# Patient Record
Sex: Female | Born: 1935 | Race: White | Hispanic: No
Health system: Southern US, Community
[De-identification: ages and names within clinical notes are randomized; demographics above are authoritative.]

---

## 2004-05-22 ENCOUNTER — Ambulatory Visit: Payer: Self-pay | Admitting: Unknown Physician Specialty

## 2004-05-30 ENCOUNTER — Ambulatory Visit: Payer: Self-pay | Admitting: Unknown Physician Specialty

## 2004-06-13 ENCOUNTER — Ambulatory Visit: Payer: Self-pay | Admitting: Unknown Physician Specialty

## 2004-07-14 ENCOUNTER — Ambulatory Visit: Payer: Self-pay | Admitting: Unknown Physician Specialty

## 2005-01-14 ENCOUNTER — Ambulatory Visit: Payer: Self-pay | Admitting: Unknown Physician Specialty

## 2005-12-09 ENCOUNTER — Ambulatory Visit: Payer: Self-pay | Admitting: Unknown Physician Specialty

## 2010-04-20 ENCOUNTER — Inpatient Hospital Stay: Payer: Self-pay | Admitting: Internal Medicine

## 2010-05-16 ENCOUNTER — Ambulatory Visit: Payer: Self-pay | Admitting: Unknown Physician Specialty

## 2011-08-03 ENCOUNTER — Emergency Department: Payer: Self-pay | Admitting: *Deleted

## 2011-08-03 LAB — COMPREHENSIVE METABOLIC PANEL
Albumin: 4 g/dL (ref 3.4–5.0)
Alkaline Phosphatase: 112 U/L (ref 50–136)
Bilirubin,Total: 0.9 mg/dL (ref 0.2–1.0)
Calcium, Total: 9.2 mg/dL (ref 8.5–10.1)
Co2: 29 mmol/L (ref 21–32)
Creatinine: 0.81 mg/dL (ref 0.60–1.30)
EGFR (African American): >60
EGFR (Non-African Amer.): >60
Osmolality: 290 (ref 275–301)
Potassium: 4.2 mmol/L (ref 3.5–5.1)
SGOT(AST): 39 U/L — ABNORMAL HIGH (ref 15–37)
SGPT (ALT): 32 U/L
Total Protein: 7.3 g/dL (ref 6.4–8.2)

## 2011-08-03 LAB — CK TOTAL AND CKMB (NOT AT ARMC)
CK, Total: 98 U/L (ref 21–215)
CK-MB: 1.1 ng/mL (ref 0.5–3.6)

## 2011-08-03 LAB — PROTIME-INR
INR: 1
Prothrombin Time: 13.3 secs (ref 11.5–14.7)

## 2011-08-03 LAB — CBC
MCH: 32 pg (ref 26.0–34.0)
Platelet: 165 10*3/uL (ref 150–440)
RDW: 13.6 % (ref 11.5–14.5)

## 2011-08-03 LAB — TROPONIN I: Troponin-I: <0.02 ng/mL

## 2011-08-03 LAB — APTT: Activated PTT: 36 secs — ABNORMAL HIGH (ref 23.6–35.9)

## 2011-11-30 ENCOUNTER — Emergency Department: Payer: Self-pay | Admitting: Emergency Medicine

## 2012-01-12 ENCOUNTER — Emergency Department: Payer: Self-pay | Admitting: Emergency Medicine

## 2012-01-12 LAB — LIPASE, BLOOD: Lipase: 69 U/L — ABNORMAL LOW (ref 73–393)

## 2012-01-12 LAB — URINALYSIS, COMPLETE
Bilirubin,UR: NEGATIVE
Glucose,UR: 50 mg/dL (ref 0–75)
Ketone: NEGATIVE
RBC,UR: 36 /HPF (ref 0–5)
Specific Gravity: 1.01 (ref 1.003–1.030)
Squamous Epithelial: 13
Transitional Epi: 7
WBC UR: 119 /HPF (ref 0–5)

## 2012-01-12 LAB — COMPREHENSIVE METABOLIC PANEL
Albumin: 3.4 g/dL (ref 3.4–5.0)
Alkaline Phosphatase: 331 U/L — ABNORMAL HIGH (ref 50–136)
BUN: 14 mg/dL (ref 7–18)
Bilirubin,Total: 1 mg/dL (ref 0.2–1.0)
Chloride: 102 mmol/L (ref 98–107)
Creatinine: 0.87 mg/dL (ref 0.60–1.30)
EGFR (African American): >60
EGFR (Non-African Amer.): 58 — ABNORMAL LOW
Glucose: 275 mg/dL — ABNORMAL HIGH (ref 65–99)
Potassium: 3.3 mmol/L — ABNORMAL LOW (ref 3.5–5.1)
SGOT(AST): 65 U/L — ABNORMAL HIGH (ref 15–37)
SGPT (ALT): 52 U/L (ref 12–78)
Total Protein: 6.5 g/dL (ref 6.4–8.2)

## 2012-01-12 LAB — CBC
HCT: 45.1 % (ref 35.0–47.0)
MCHC: 32.6 g/dL (ref 32.0–36.0)
MCV: 100 fL (ref 80–100)
Platelet: 198 10*3/uL (ref 150–440)
RBC: 4.52 10*6/uL (ref 3.80–5.20)
RDW: 14.9 % — ABNORMAL HIGH (ref 11.5–14.5)

## 2012-02-07 ENCOUNTER — Ambulatory Visit: Payer: Self-pay | Admitting: Obstetrics and Gynecology

## 2012-03-12 LAB — URINALYSIS, COMPLETE
Glucose,UR: NEGATIVE mg/dL (ref 0–75)
Ph: 7 (ref 4.5–8.0)
RBC,UR: 1 /HPF (ref 0–5)
Squamous Epithelial: <1
WBC UR: 5 /HPF (ref 0–5)

## 2012-03-12 LAB — CBC WITH DIFFERENTIAL/PLATELET
Basophil %: 0.7 %
Lymphocyte #: 1.1 10*3/uL (ref 1.0–3.6)
MCH: 33.8 pg (ref 26.0–34.0)
MCHC: 33.7 g/dL (ref 32.0–36.0)
MCV: 101 fL — ABNORMAL HIGH (ref 80–100)
Monocyte #: 0.8 x10 3/mm (ref 0.2–0.9)
Neutrophil #: 6.7 10*3/uL — ABNORMAL HIGH (ref 1.4–6.5)
Neutrophil %: 75 %
Platelet: 193 10*3/uL (ref 150–440)
RDW: 14.4 % (ref 11.5–14.5)
WBC: 8.9 10*3/uL (ref 3.6–11.0)

## 2012-03-12 LAB — APTT: Activated PTT: 38.9 secs — ABNORMAL HIGH (ref 23.6–35.9)

## 2012-03-12 LAB — LIPID PANEL
Cholesterol: 136 mg/dL (ref 0–200)
HDL Cholesterol: 83 mg/dL — ABNORMAL HIGH (ref 40–60)
Ldl Cholesterol, Calc: 35 mg/dL (ref 0–100)
VLDL Cholesterol, Calc: 18 mg/dL (ref 5–40)

## 2012-03-12 LAB — COMPREHENSIVE METABOLIC PANEL
Calcium, Total: 9.4 mg/dL (ref 8.5–10.1)
Chloride: 96 mmol/L — ABNORMAL LOW (ref 98–107)
Co2: 35 mmol/L — ABNORMAL HIGH (ref 21–32)
EGFR (African American): >60
EGFR (Non-African Amer.): >60
SGOT(AST): 42 U/L — ABNORMAL HIGH (ref 15–37)
SGPT (ALT): 33 U/L (ref 12–78)

## 2012-03-12 LAB — CK TOTAL AND CKMB (NOT AT ARMC)
CK, Total: 116 U/L (ref 21–215)
CK, Total: 74 U/L (ref 21–215)
CK-MB: 3.2 ng/mL (ref 0.5–3.6)
CK-MB: 2.3 ng/mL (ref 0.5–3.6)

## 2012-03-12 LAB — TROPONIN I: Troponin-I: 0.05 ng/mL

## 2012-03-12 LAB — PRO B NATRIURETIC PEPTIDE: B-Type Natriuretic Peptide: 7021 pg/mL — ABNORMAL HIGH (ref 0–450)

## 2012-03-13 ENCOUNTER — Inpatient Hospital Stay: Payer: Self-pay | Admitting: Internal Medicine

## 2012-03-13 LAB — CBC WITH DIFFERENTIAL/PLATELET
Basophil #: 0 10*3/uL (ref 0.0–0.1)
Eosinophil #: 0.1 10*3/uL (ref 0.0–0.7)
HCT: 41.8 % (ref 35.0–47.0)
Lymphocyte #: 0.8 10*3/uL — ABNORMAL LOW (ref 1.0–3.6)
Lymphocyte %: 7.9 %
MCHC: 34.3 g/dL (ref 32.0–36.0)
MCV: 101 fL — ABNORMAL HIGH (ref 80–100)
Monocyte #: 1.1 x10 3/mm — ABNORMAL HIGH (ref 0.2–0.9)
Neutrophil #: 8.1 10*3/uL — ABNORMAL HIGH (ref 1.4–6.5)
Platelet: 194 10*3/uL (ref 150–440)
RDW: 14.1 % (ref 11.5–14.5)
WBC: 10.2 10*3/uL (ref 3.6–11.0)

## 2012-03-13 LAB — BASIC METABOLIC PANEL
Anion Gap: 8 (ref 7–16)
Chloride: 98 mmol/L (ref 98–107)
Co2: 39 mmol/L — ABNORMAL HIGH (ref 21–32)
EGFR (Non-African Amer.): >60
Osmolality: 295 (ref 275–301)
Potassium: 2.5 mmol/L — CL (ref 3.5–5.1)

## 2012-03-13 LAB — POTASSIUM: Potassium: 3.1 mmol/L — ABNORMAL LOW (ref 3.5–5.1)

## 2012-03-13 LAB — CK TOTAL AND CKMB (NOT AT ARMC): CK-MB: 2 ng/mL (ref 0.5–3.6)

## 2012-03-13 LAB — TROPONIN I: Troponin-I: 0.06 ng/mL — ABNORMAL HIGH

## 2012-03-14 LAB — BASIC METABOLIC PANEL
Anion Gap: 7 (ref 7–16)
BUN: 16 mg/dL (ref 7–18)
Chloride: 98 mmol/L (ref 98–107)
Creatinine: 0.62 mg/dL (ref 0.60–1.30)
EGFR (African American): >60
EGFR (Non-African Amer.): >60
Glucose: 178 mg/dL — ABNORMAL HIGH (ref 65–99)
Osmolality: 287 (ref 275–301)
Sodium: 141 mmol/L (ref 136–145)

## 2012-03-14 LAB — URINE CULTURE

## 2012-03-15 LAB — POTASSIUM: Potassium: 3.8 mmol/L (ref 3.5–5.1)

## 2012-04-15 ENCOUNTER — Emergency Department: Payer: Self-pay | Admitting: Emergency Medicine

## 2012-04-15 LAB — COMPREHENSIVE METABOLIC PANEL
Albumin: 3.5 g/dL (ref 3.4–5.0)
Anion Gap: 5 — ABNORMAL LOW (ref 7–16)
BUN: 16 mg/dL (ref 7–18)
Bilirubin,Total: 0.4 mg/dL (ref 0.2–1.0)
Chloride: 102 mmol/L (ref 98–107)
Co2: 33 mmol/L — ABNORMAL HIGH (ref 21–32)
Creatinine: 0.95 mg/dL (ref 0.60–1.30)
EGFR (African American): >60
EGFR (Non-African Amer.): 52 — ABNORMAL LOW
Glucose: 116 mg/dL — ABNORMAL HIGH (ref 65–99)
Potassium: 4.1 mmol/L (ref 3.5–5.1)
SGPT (ALT): 31 U/L (ref 12–78)
Total Protein: 7 g/dL (ref 6.4–8.2)

## 2012-04-15 LAB — CBC WITH DIFFERENTIAL/PLATELET
Basophil #: 0.1 10*3/uL (ref 0.0–0.1)
Basophil %: 0.8 %
Eosinophil #: 0.2 10*3/uL (ref 0.0–0.7)
HCT: 39.8 % (ref 35.0–47.0)
HGB: 13.4 g/dL (ref 12.0–16.0)
Lymphocyte %: 11.8 %
MCHC: 33.7 g/dL (ref 32.0–36.0)
MCV: 102 fL — ABNORMAL HIGH (ref 80–100)
Monocyte %: 10.9 %
RBC: 3.91 10*6/uL (ref 3.80–5.20)
WBC: 8.8 10*3/uL (ref 3.6–11.0)

## 2012-04-15 LAB — URINALYSIS, COMPLETE
Bilirubin,UR: NEGATIVE
Blood: NEGATIVE
Hyaline Cast: 1
Ketone: NEGATIVE
Nitrite: NEGATIVE
Ph: 7 (ref 4.5–8.0)
Protein: NEGATIVE
RBC,UR: 1 /HPF (ref 0–5)
Specific Gravity: 1.005 (ref 1.003–1.030)
Squamous Epithelial: NONE SEEN

## 2012-04-15 LAB — TSH: Thyroid Stimulating Horm: 3.77 u[IU]/mL

## 2012-04-15 LAB — TROPONIN I: Troponin-I: <0.02 ng/mL

## 2012-07-19 IMAGING — CR DG LUMBAR SPINE 2-3V
1 series · 4 of 4 positions shown · non-contrast
Comparison: none

REASON FOR EXAM: s/p fall with pain
COMMENTS:

[Series 1: view not recorded · 0.17mm/px · 4 of 4 slices shown]
[im 1/4]
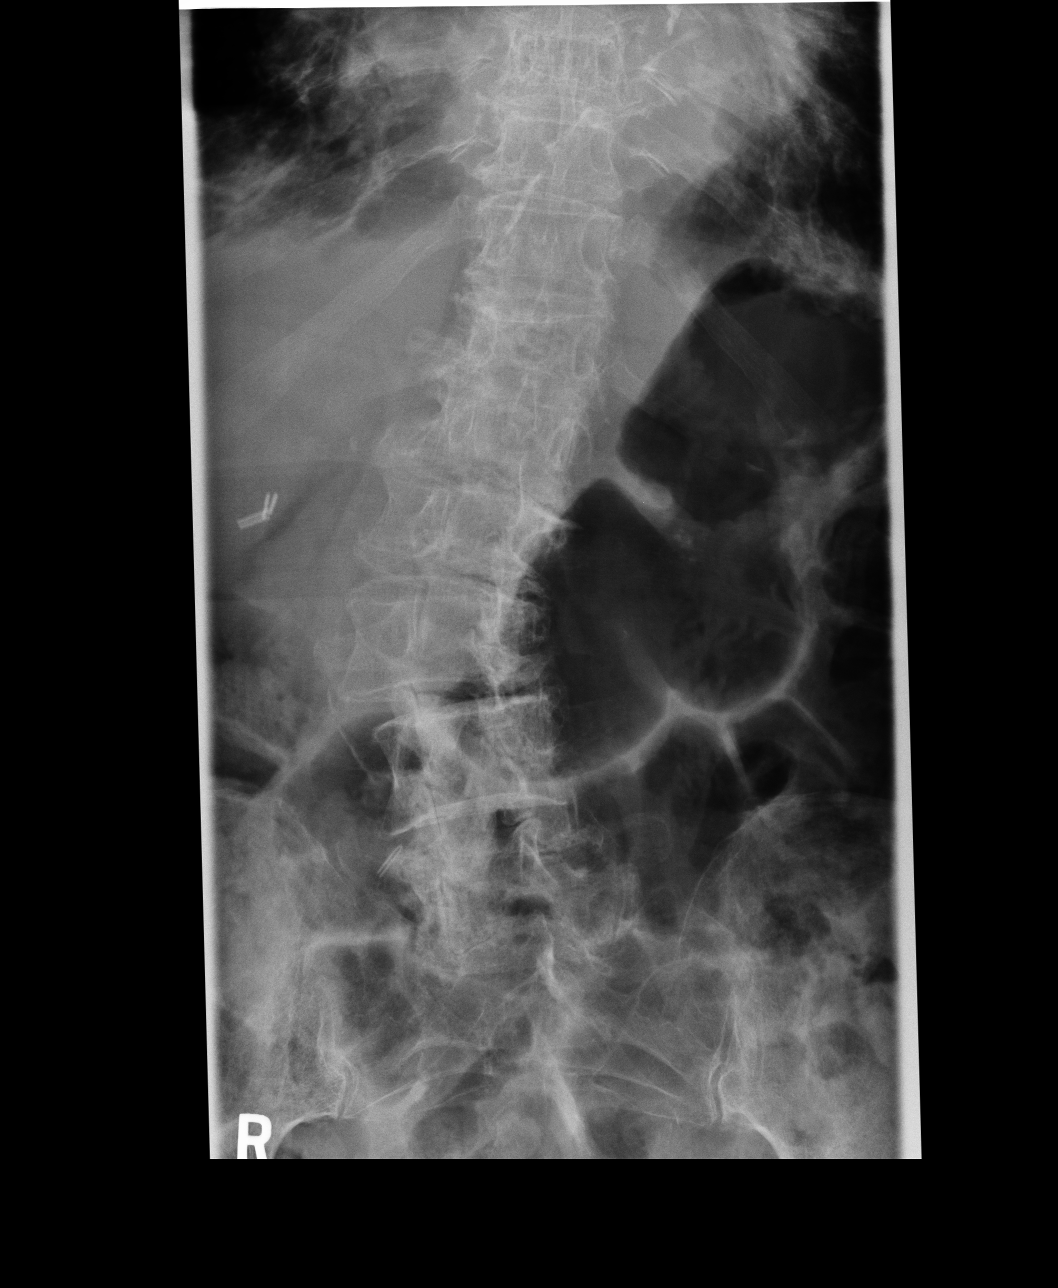
[im 2/4]
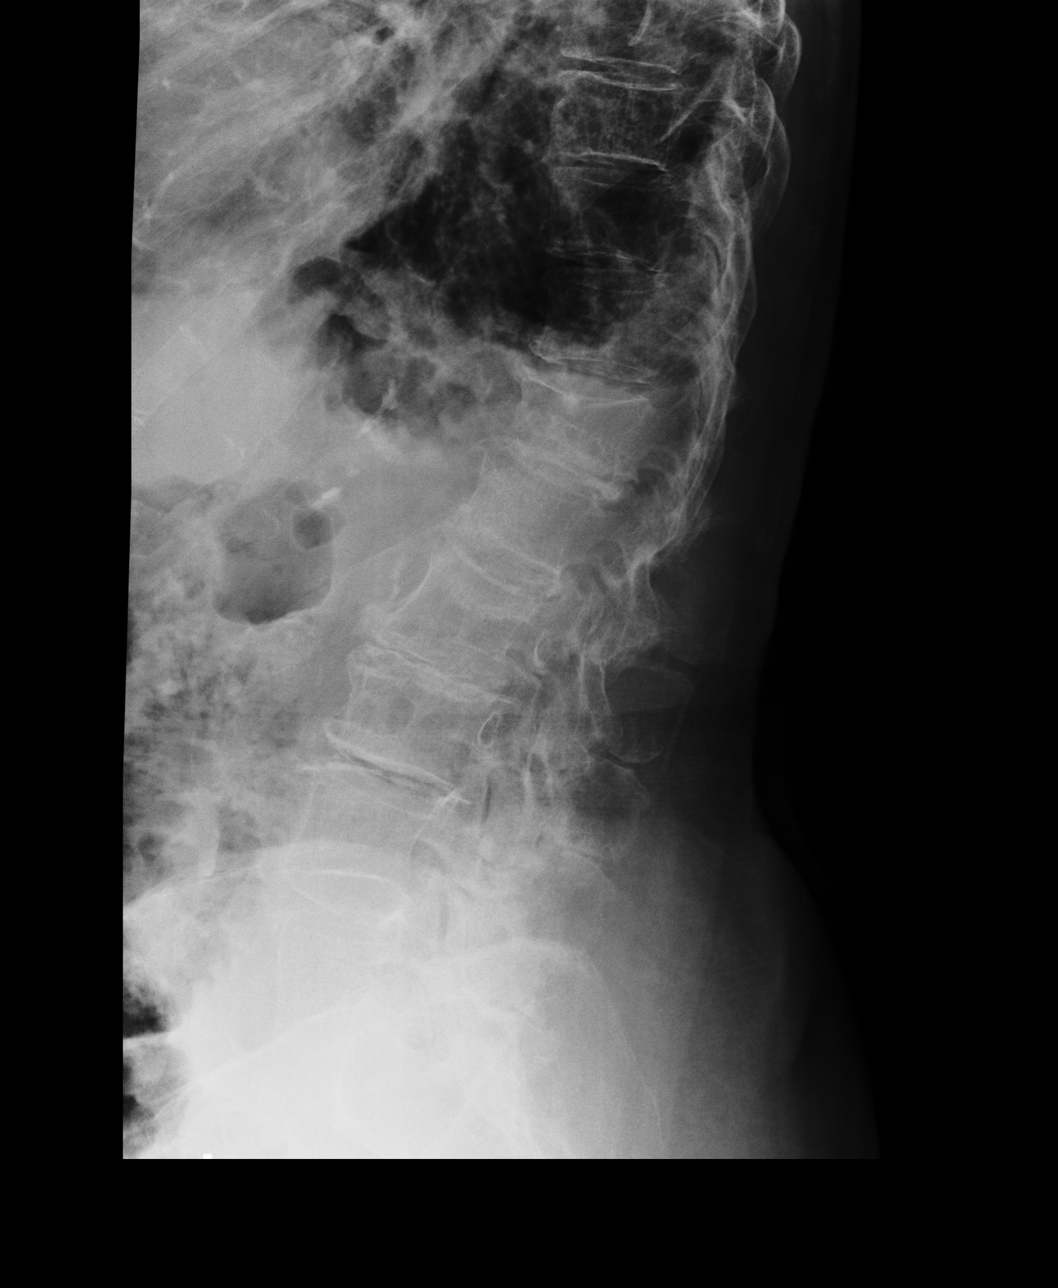
[im 3/4]
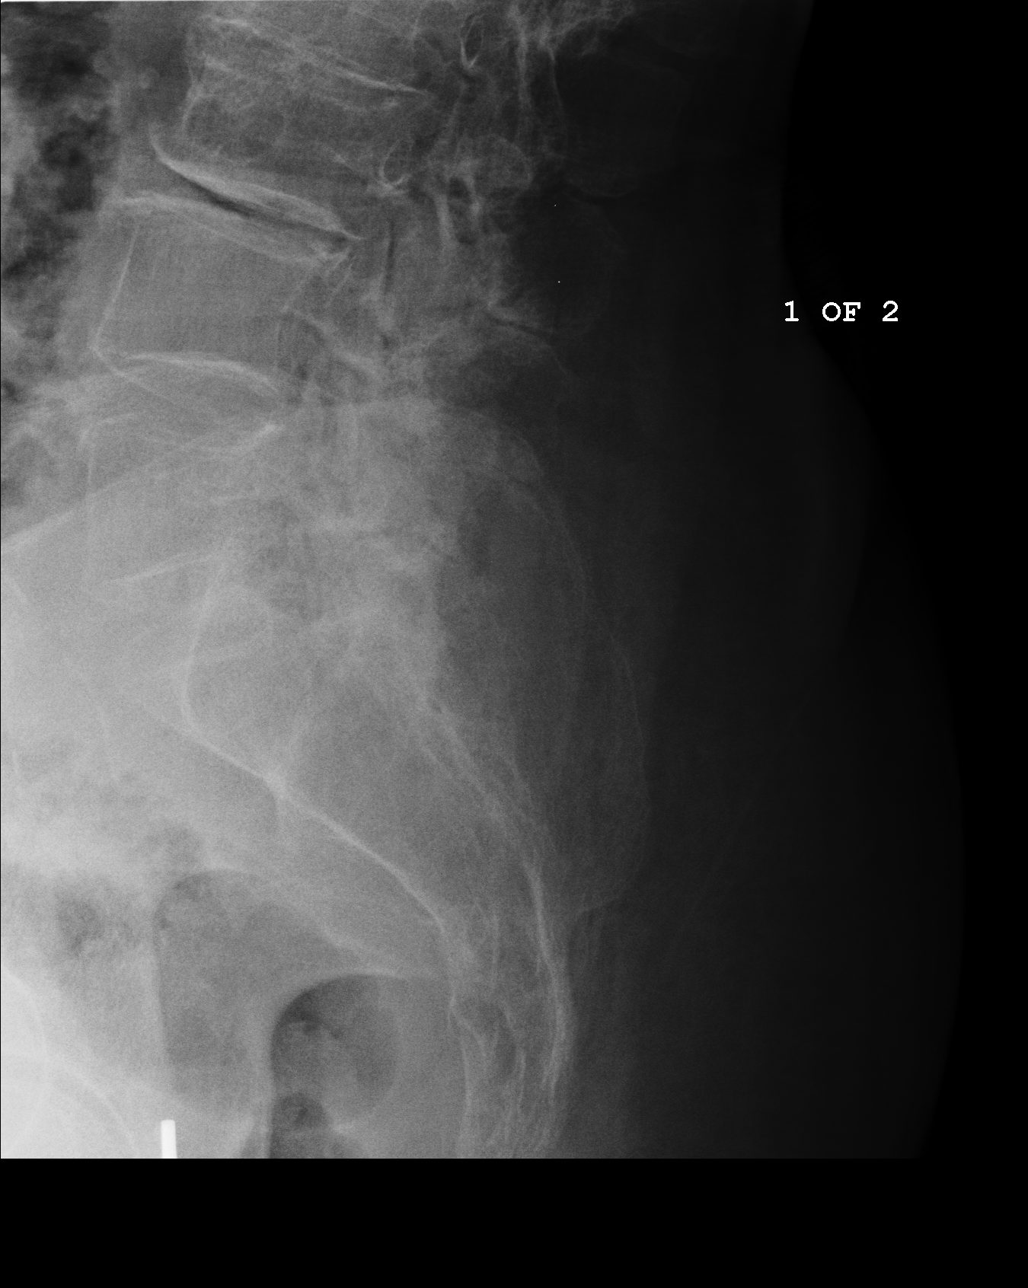
[im 4/4]
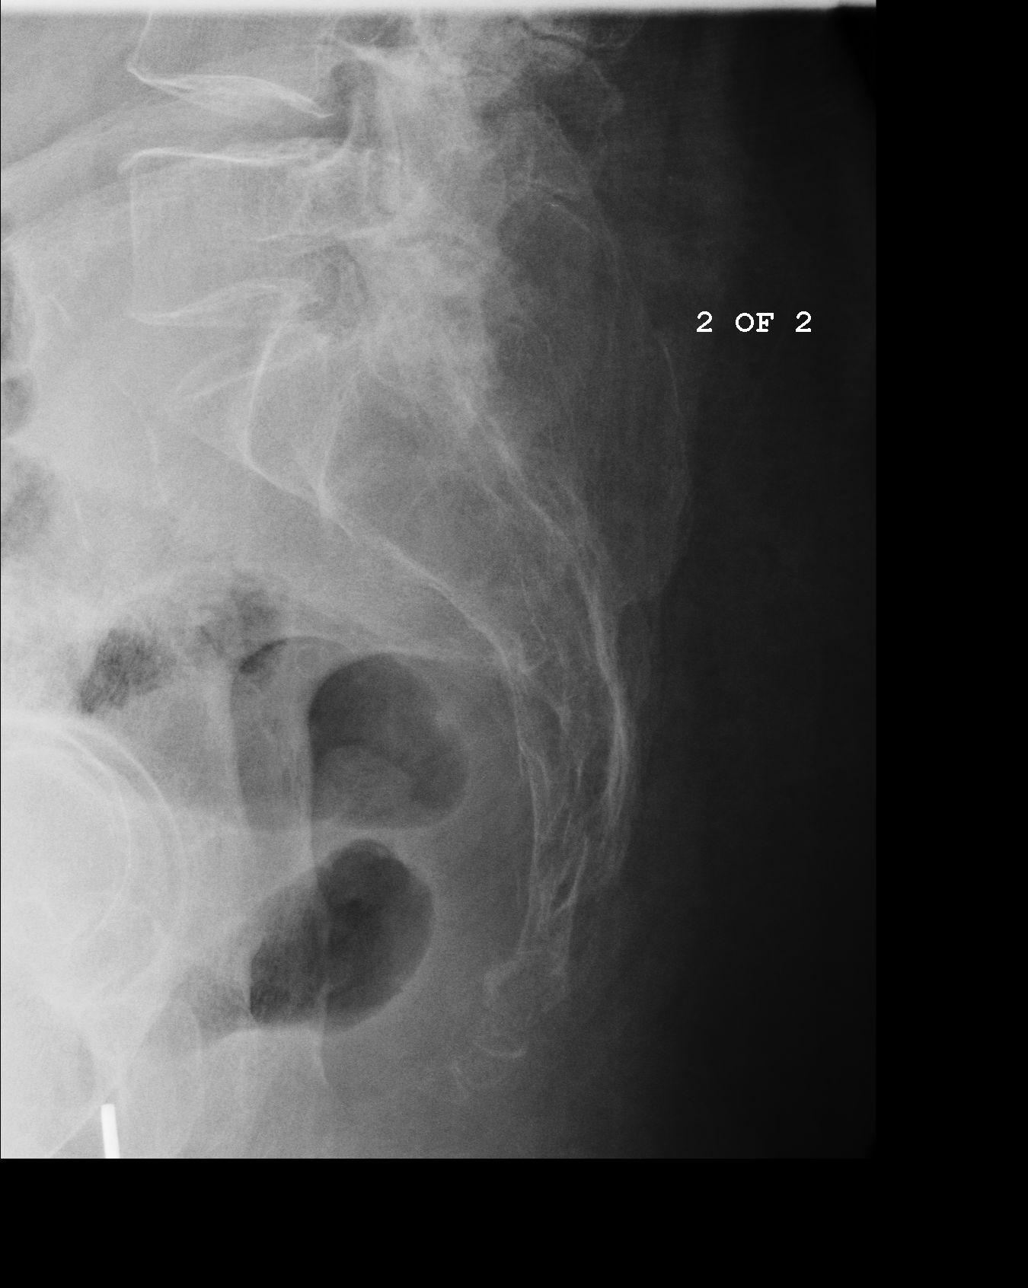

[4 of 4 positions shown; findings below may reference images not displayed]

PROCEDURE:     DXR - DXR LUMBAR SPINE AP AND LATERAL  - April 20, 2010 [DATE]

RESULT:     Diffuse severe degenerative change is noted of the lumbar spinal
with scoliosis, concave to the left. Surgical clips are noted in the right
upper quadrant. There is a T12 mild compression fracture. The lumbar
vertebrae are numbered with the lowest segmented lumbar shaped vertebral
body as L5. Mild compression of L5 is also noted.
IMPRESSION: Mild T12 compression fracture. Diffuse osteopenia and
degenerative change.

## 2012-10-18 ENCOUNTER — Emergency Department: Payer: Self-pay | Admitting: Emergency Medicine

## 2012-10-18 LAB — CBC
HCT: 36.8 % (ref 35.0–47.0)
HGB: 12.5 g/dL (ref 12.0–16.0)
MCH: 33 pg (ref 26.0–34.0)
MCHC: 34 g/dL (ref 32.0–36.0)
MCV: 97 fL (ref 80–100)
Platelet: 169 10*3/uL (ref 150–440)
RBC: 3.79 10*6/uL — ABNORMAL LOW (ref 3.80–5.20)
RDW: 13.6 % (ref 11.5–14.5)

## 2012-10-18 LAB — COMPREHENSIVE METABOLIC PANEL
Alkaline Phosphatase: 151 U/L — ABNORMAL HIGH (ref 50–136)
Bilirubin,Total: 0.7 mg/dL (ref 0.2–1.0)
Calcium, Total: 9.4 mg/dL (ref 8.5–10.1)
Creatinine: 0.81 mg/dL (ref 0.60–1.30)
Osmolality: 285 (ref 275–301)
Potassium: 3.7 mmol/L (ref 3.5–5.1)
SGOT(AST): 30 U/L (ref 15–37)
SGPT (ALT): 21 U/L (ref 12–78)
Sodium: 140 mmol/L (ref 136–145)
Total Protein: 6.8 g/dL (ref 6.4–8.2)

## 2012-10-18 LAB — URINALYSIS, COMPLETE
Bacteria: NONE SEEN
Hyaline Cast: 1
Ketone: NEGATIVE
RBC,UR: 5 /HPF (ref 0–5)
WBC UR: <1 /HPF (ref 0–5)

## 2012-10-18 LAB — TROPONIN I: Troponin-I: <0.02 ng/mL

## 2012-12-04 ENCOUNTER — Emergency Department: Payer: Self-pay | Admitting: Emergency Medicine

## 2012-12-04 LAB — COMPREHENSIVE METABOLIC PANEL
Albumin: 3.6 g/dL (ref 3.4–5.0)
Bilirubin,Total: 0.4 mg/dL (ref 0.2–1.0)
Calcium, Total: 9.2 mg/dL (ref 8.5–10.1)
Co2: 32 mmol/L (ref 21–32)
EGFR (African American): >60
EGFR (Non-African Amer.): >60
Glucose: 258 mg/dL — ABNORMAL HIGH (ref 65–99)
Potassium: 4 mmol/L (ref 3.5–5.1)
SGOT(AST): 26 U/L (ref 15–37)
SGPT (ALT): 21 U/L (ref 12–78)

## 2012-12-04 LAB — TROPONIN I: Troponin-I: <0.02 ng/mL

## 2012-12-04 LAB — URINALYSIS, COMPLETE
Bacteria: NONE SEEN
Bilirubin,UR: NEGATIVE
Glucose,UR: NEGATIVE mg/dL (ref 0–75)
Hyaline Cast: 4
Nitrite: NEGATIVE
Protein: NEGATIVE
RBC,UR: 5 /HPF (ref 0–5)
Specific Gravity: 1.01 (ref 1.003–1.030)
WBC UR: 34 /HPF (ref 0–5)

## 2012-12-04 LAB — CBC
MCH: 33.6 pg (ref 26.0–34.0)
RDW: 13.9 % (ref 11.5–14.5)

## 2013-06-02 ENCOUNTER — Emergency Department: Payer: Self-pay | Admitting: Emergency Medicine

## 2013-06-02 LAB — COMPREHENSIVE METABOLIC PANEL WITH GFR
Albumin: 3.3 g/dL — ABNORMAL LOW
Alkaline Phosphatase: 121 U/L — ABNORMAL HIGH
Anion Gap: 6 — ABNORMAL LOW
BUN: 21 mg/dL — ABNORMAL HIGH
Bilirubin,Total: 0.6 mg/dL
Calcium, Total: 10 mg/dL
Chloride: 100 mmol/L
Co2: 32 mmol/L
Creatinine: 1.02 mg/dL
EGFR (African American): 55 — ABNORMAL LOW
EGFR (Non-African Amer.): 47 — ABNORMAL LOW
Glucose: 183 mg/dL — ABNORMAL HIGH
Osmolality: 283
Potassium: 3.6 mmol/L
SGOT(AST): 35 U/L
SGPT (ALT): 21 U/L
Sodium: 138 mmol/L
Total Protein: 6.9 g/dL

## 2013-06-02 LAB — CBC
HCT: 39.1 % (ref 35.0–47.0)
HGB: 13.3 g/dL (ref 12.0–16.0)
MCH: 33 pg (ref 26.0–34.0)
MCHC: 34 g/dL (ref 32.0–36.0)
MCV: 97 fL (ref 80–100)
PLATELETS: 202 10*3/uL (ref 150–440)
RBC: 4.03 10*6/uL (ref 3.80–5.20)
RDW: 13.7 % (ref 11.5–14.5)
WBC: 8.4 10*3/uL (ref 3.6–11.0)

## 2013-06-02 LAB — URINALYSIS, COMPLETE
Bacteria: NONE SEEN
Bilirubin,UR: NEGATIVE
Blood: NEGATIVE
Glucose,UR: NEGATIVE mg/dL
Hyaline Cast: 24
Ketone: NEGATIVE
Nitrite: NEGATIVE
Ph: 7
Protein: NEGATIVE
RBC,UR: 1 /HPF
Renal Epithelial: 1
Specific Gravity: 1.006
Squamous Epithelial: 1
WBC UR: 11 /HPF

## 2013-06-02 LAB — TROPONIN I

## 2013-06-07 ENCOUNTER — Emergency Department: Payer: Self-pay | Admitting: Emergency Medicine

## 2013-06-07 LAB — CBC WITH DIFFERENTIAL/PLATELET
Basophil #: 0.1 10*3/uL (ref 0.0–0.1)
Basophil %: 0.8 %
Eosinophil #: 0.1 10*3/uL (ref 0.0–0.7)
Eosinophil %: 1.2 %
HCT: 39.4 % (ref 35.0–47.0)
HGB: 12.7 g/dL (ref 12.0–16.0)
Lymphocyte #: 1.8 10*3/uL (ref 1.0–3.6)
Lymphocyte %: 14.7 %
MCH: 31.8 pg (ref 26.0–34.0)
MCHC: 32.2 g/dL (ref 32.0–36.0)
MCV: 99 fL (ref 80–100)
MONOS PCT: 11.7 %
Monocyte #: 1.4 x10 3/mm — ABNORMAL HIGH (ref 0.2–0.9)
NEUTROS ABS: 8.9 10*3/uL — AB (ref 1.4–6.5)
Neutrophil %: 71.6 %
Platelet: 255 10*3/uL (ref 150–440)
RBC: 3.98 10*6/uL (ref 3.80–5.20)
RDW: 14.1 % (ref 11.5–14.5)
WBC: 12.4 10*3/uL — AB (ref 3.6–11.0)

## 2013-06-07 LAB — COMPREHENSIVE METABOLIC PANEL
ALBUMIN: 3.3 g/dL — AB (ref 3.4–5.0)
Alkaline Phosphatase: 109 U/L
Anion Gap: 3 — ABNORMAL LOW (ref 7–16)
BILIRUBIN TOTAL: 0.4 mg/dL (ref 0.2–1.0)
BUN: 24 mg/dL — AB (ref 7–18)
Calcium, Total: 9.9 mg/dL (ref 8.5–10.1)
Chloride: 96 mmol/L — ABNORMAL LOW (ref 98–107)
Co2: 34 mmol/L — ABNORMAL HIGH (ref 21–32)
Creatinine: 1.11 mg/dL (ref 0.60–1.30)
EGFR (Non-African Amer.): 43 — ABNORMAL LOW
GFR CALC AF AMER: 50 — AB
Glucose: 205 mg/dL — ABNORMAL HIGH (ref 65–99)
OSMOLALITY: 276 (ref 275–301)
POTASSIUM: 3.7 mmol/L (ref 3.5–5.1)
SGOT(AST): 31 U/L (ref 15–37)
SGPT (ALT): 24 U/L (ref 12–78)
Sodium: 133 mmol/L — ABNORMAL LOW (ref 136–145)
Total Protein: 7 g/dL (ref 6.4–8.2)

## 2013-09-20 ENCOUNTER — Observation Stay: Payer: Self-pay | Admitting: Internal Medicine

## 2013-09-20 LAB — COMPREHENSIVE METABOLIC PANEL
ALBUMIN: 3.5 g/dL (ref 3.4–5.0)
ANION GAP: 7 (ref 7–16)
Alkaline Phosphatase: 91 U/L
BILIRUBIN TOTAL: 0.4 mg/dL (ref 0.2–1.0)
BUN: 26 mg/dL — AB (ref 7–18)
CALCIUM: 10.1 mg/dL (ref 8.5–10.1)
Chloride: 99 mmol/L (ref 98–107)
Co2: 29 mmol/L (ref 21–32)
Creatinine: 1.15 mg/dL (ref 0.60–1.30)
EGFR (African American): 47 — ABNORMAL LOW
EGFR (Non-African Amer.): 41 — ABNORMAL LOW
Glucose: 294 mg/dL — ABNORMAL HIGH (ref 65–99)
Osmolality: 286 (ref 275–301)
Potassium: 4.3 mmol/L (ref 3.5–5.1)
SGOT(AST): 34 U/L (ref 15–37)
SGPT (ALT): 24 U/L (ref 12–78)
Sodium: 135 mmol/L — ABNORMAL LOW (ref 136–145)
TOTAL PROTEIN: 7 g/dL (ref 6.4–8.2)

## 2013-09-20 LAB — CBC WITH DIFFERENTIAL/PLATELET
BASOS ABS: 0 10*3/uL (ref 0.0–0.1)
BASOS PCT: 0.2 %
EOS PCT: 0.1 %
Eosinophil #: 0 10*3/uL (ref 0.0–0.7)
HCT: 41.9 % (ref 35.0–47.0)
HGB: 13.5 g/dL (ref 12.0–16.0)
LYMPHS PCT: 11.9 %
Lymphocyte #: 0.7 10*3/uL — ABNORMAL LOW (ref 1.0–3.6)
MCH: 32.6 pg (ref 26.0–34.0)
MCHC: 32.3 g/dL (ref 32.0–36.0)
MCV: 101 fL — ABNORMAL HIGH (ref 80–100)
MONO ABS: 0.2 x10 3/mm (ref 0.2–0.9)
Monocyte %: 3.5 %
NEUTROS PCT: 84.3 %
Neutrophil #: 5.1 10*3/uL (ref 1.4–6.5)
Platelet: 169 10*3/uL (ref 150–440)
RBC: 4.15 10*6/uL (ref 3.80–5.20)
RDW: 13.6 % (ref 11.5–14.5)
WBC: 6.1 10*3/uL (ref 3.6–11.0)

## 2013-09-20 LAB — TSH: THYROID STIMULATING HORM: 1.5 u[IU]/mL

## 2013-09-20 LAB — TROPONIN I: Troponin-I: <0.02 ng/mL

## 2013-09-21 LAB — URINALYSIS, COMPLETE
BILIRUBIN, UR: NEGATIVE
BLOOD: NEGATIVE
Bacteria: NONE SEEN
Ketone: NEGATIVE
LEUKOCYTE ESTERASE: NEGATIVE
NITRITE: NEGATIVE
Ph: 6 (ref 4.5–8.0)
Protein: NEGATIVE
RBC,UR: 1 /HPF (ref 0–5)
SQUAMOUS EPITHELIAL: NONE SEEN
Specific Gravity: 1.01 (ref 1.003–1.030)
WBC UR: 1 /HPF (ref 0–5)

## 2013-09-21 LAB — LIPID PANEL
Cholesterol: 127 mg/dL (ref 0–200)
HDL Cholesterol: 71 mg/dL — ABNORMAL HIGH (ref 40–60)
Ldl Cholesterol, Calc: 36 mg/dL (ref 0–100)
Triglycerides: 99 mg/dL (ref 0–200)
VLDL Cholesterol, Calc: 20 mg/dL (ref 5–40)

## 2013-12-09 ENCOUNTER — Ambulatory Visit: Payer: Self-pay | Admitting: Physician Assistant

## 2014-01-10 ENCOUNTER — Emergency Department: Payer: Self-pay | Admitting: Emergency Medicine

## 2014-01-10 LAB — CBC
HCT: 34.9 % — AB (ref 35.0–47.0)
HGB: 11.2 g/dL — ABNORMAL LOW (ref 12.0–16.0)
MCH: 32.5 pg (ref 26.0–34.0)
MCHC: 32.2 g/dL (ref 32.0–36.0)
MCV: 101 fL — ABNORMAL HIGH (ref 80–100)
PLATELETS: 159 10*3/uL (ref 150–440)
RBC: 3.46 10*6/uL — AB (ref 3.80–5.20)
RDW: 13.8 % (ref 11.5–14.5)
WBC: 10.1 10*3/uL (ref 3.6–11.0)

## 2014-01-10 LAB — COMPREHENSIVE METABOLIC PANEL
ALK PHOS: 76 U/L
ANION GAP: 5 — AB (ref 7–16)
Albumin: 3.2 g/dL — ABNORMAL LOW (ref 3.4–5.0)
BUN: 19 mg/dL — ABNORMAL HIGH (ref 7–18)
Bilirubin,Total: 0.8 mg/dL (ref 0.2–1.0)
CHLORIDE: 102 mmol/L (ref 98–107)
CO2: 33 mmol/L — AB (ref 21–32)
Calcium, Total: 9.1 mg/dL (ref 8.5–10.1)
Creatinine: 0.92 mg/dL (ref 0.60–1.30)
Glucose: 226 mg/dL — ABNORMAL HIGH (ref 65–99)
Osmolality: 289 (ref 275–301)
POTASSIUM: 3.9 mmol/L (ref 3.5–5.1)
SGOT(AST): 17 U/L (ref 15–37)
SGPT (ALT): 24 U/L
SODIUM: 140 mmol/L (ref 136–145)
Total Protein: 6.1 g/dL — ABNORMAL LOW (ref 6.4–8.2)

## 2014-01-10 LAB — TROPONIN I: Troponin-I: <0.02 ng/mL

## 2014-01-10 LAB — PROTIME-INR
INR: 1.8
Prothrombin Time: 20.5 secs — ABNORMAL HIGH (ref 11.5–14.7)

## 2014-01-10 LAB — APTT: ACTIVATED PTT: 43.6 s — AB (ref 23.6–35.9)

## 2014-04-12 IMAGING — CT CT ABD-PELV W/ CM
3 of 5 series · 15 of 46 positions shown, 18 images · IV contrast (APPLIED)
Comparison: None

REASON FOR EXAM: (1) pt c/o episode of severe abd [pain now resolved, hx
AAA; (2) Hx AAA unrepair
COMMENTS:

PROCEDURE:     CT  - CT ABDOMEN / PELVIS  W  - January 12, 2012  [DATE]
RESULT:     History: Abdominal pain
TECHNIQUE: Multiple axial images of the abdomen and pelvis were performed
from the lung bases to the pubic symphysis, without p.o. contrast and with
100 ml of Isovue 370 intravenous contrast.

[Series 4: aaa · axial · 0.68mm/px · z∈[-1134,-717]mm · 12 of 157 slices shown, 15 images]
[im 12/157  soft-tissue]
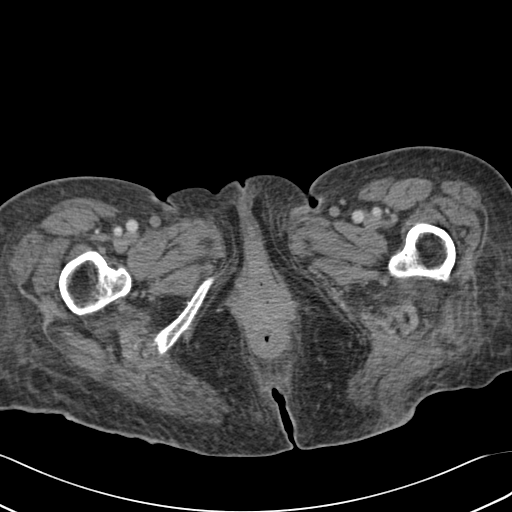
[im 12/157  bone]
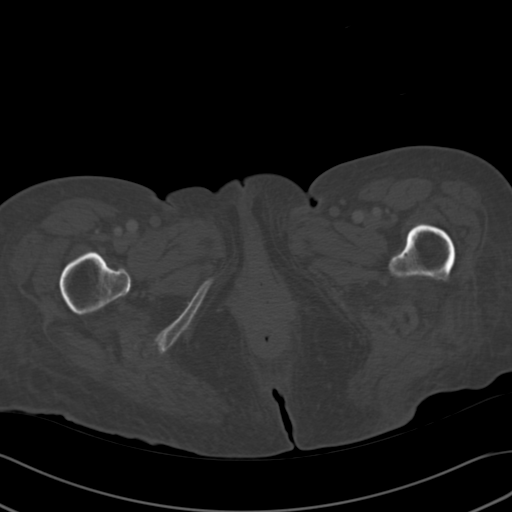
[im 29/157  soft-tissue]
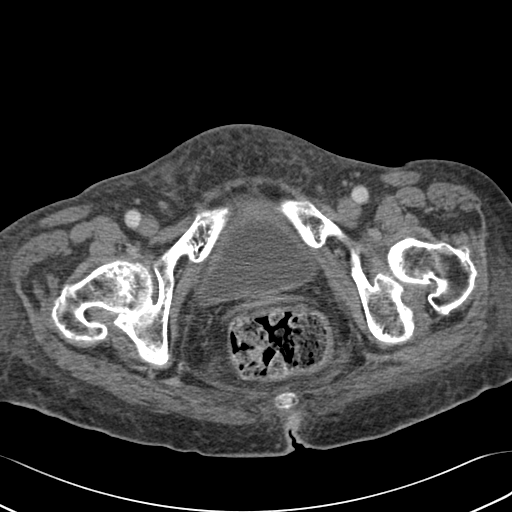
[im 47/157  soft-tissue]
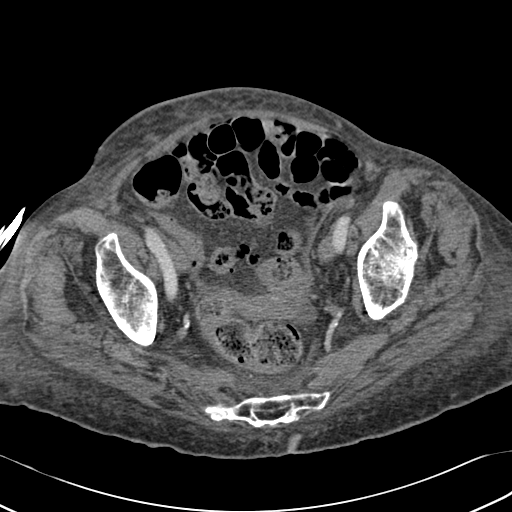
[im 64/157  soft-tissue]
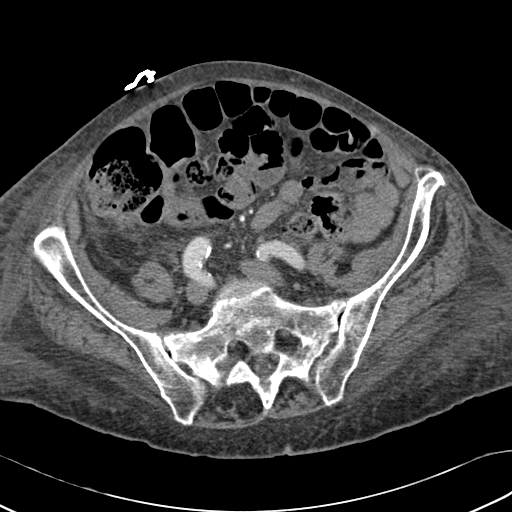
[im 81/157  soft-tissue]
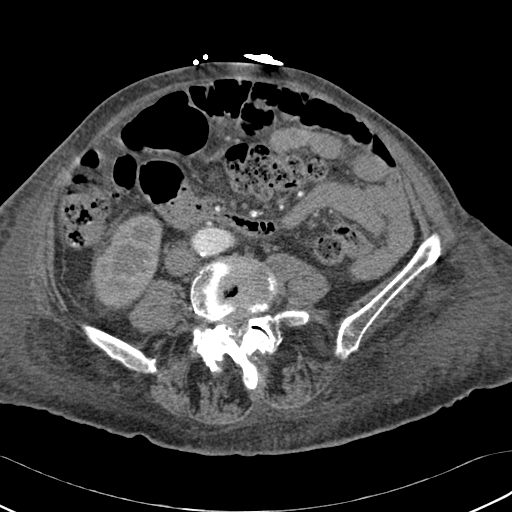
[im 93/157  soft-tissue]
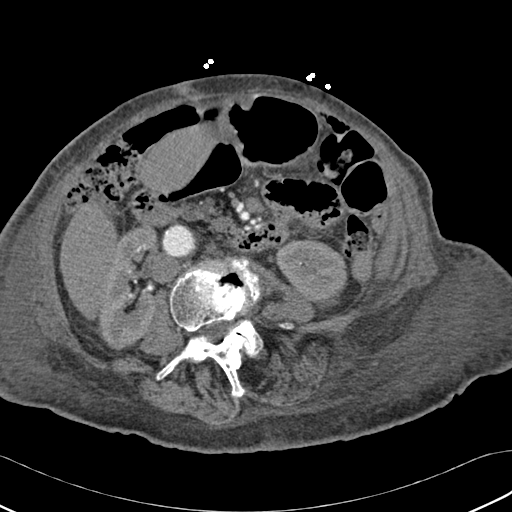
[im 110/157  soft-tissue]
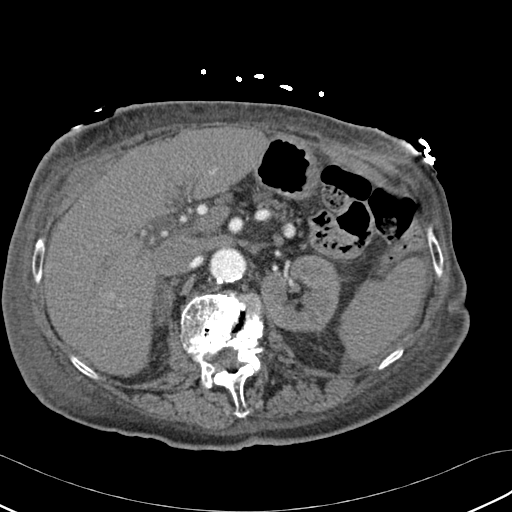
[im 128/157  soft-tissue]
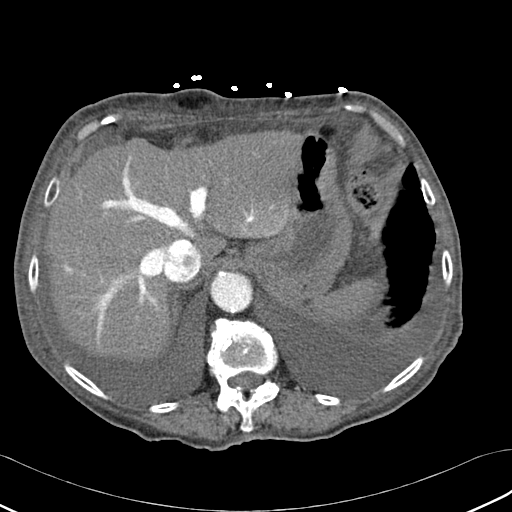
[im 133/157  lung]
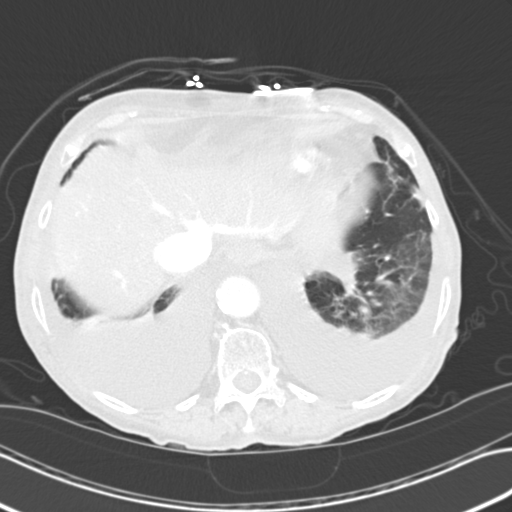
[im 139/157  lung]
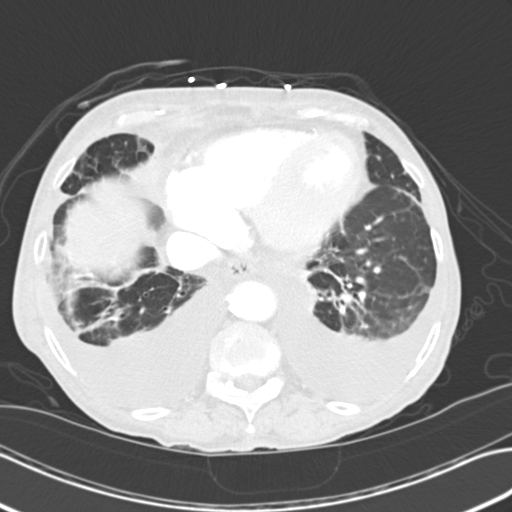
[im 145/157  soft-tissue]
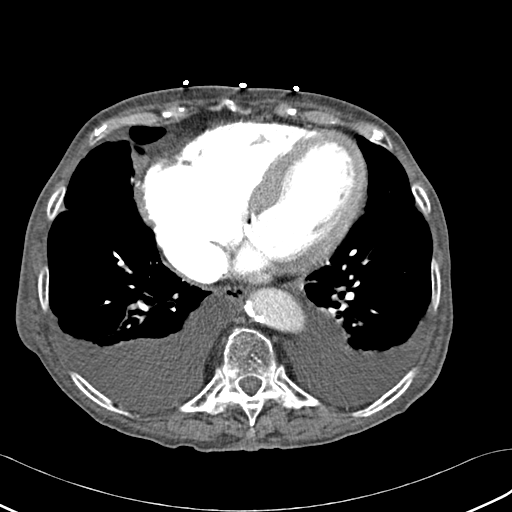
[im 145/157  lung]
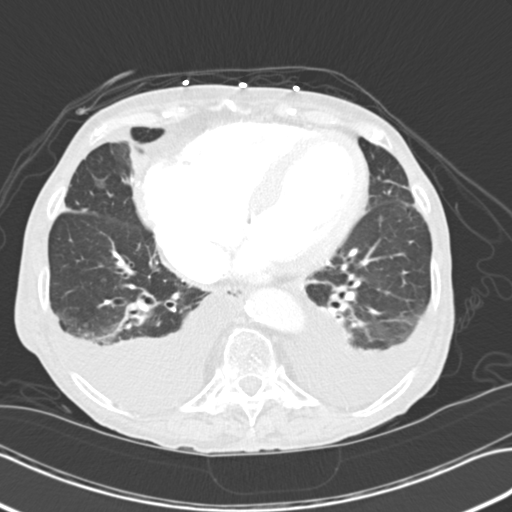
[im 145/157  bone]
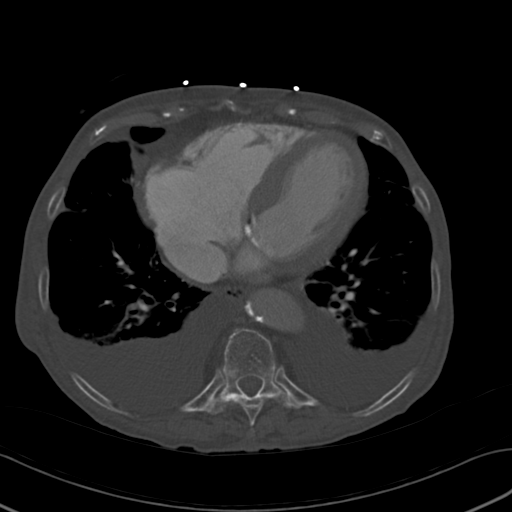
[im 151/157  lung]
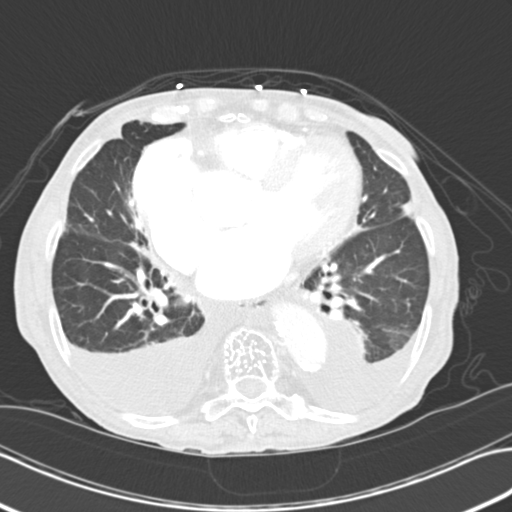

[Series 603: sagmip · sagittal · 0.93mm/px · 1 of 192 slices shown]
[im 64/192  soft-tissue]
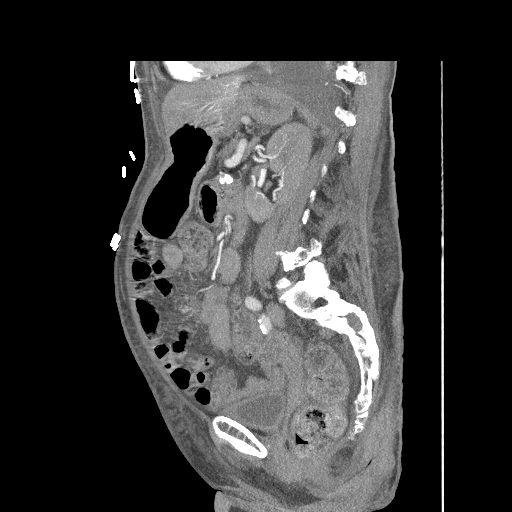

[Series 604: coronal mpr · coronal · 0.93mm/px · 2 of 80 slices shown]
[im 27/80  soft-tissue]
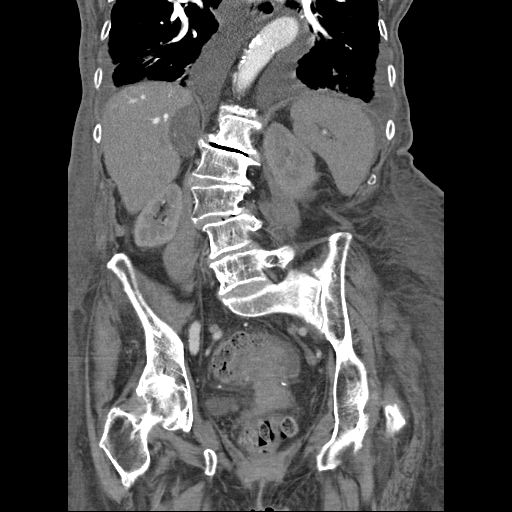
[im 53/80  soft-tissue]
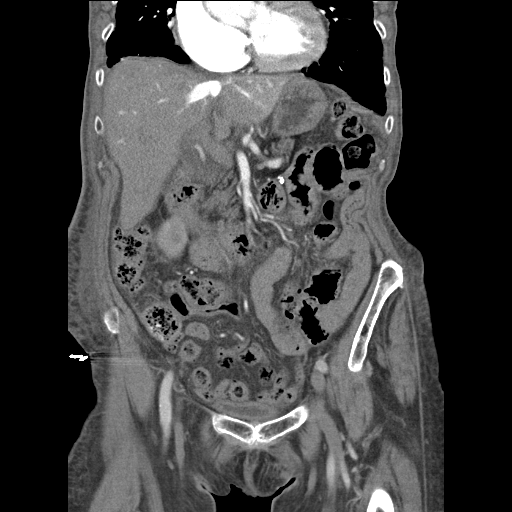

[15 of 46 positions shown; findings below may reference images not displayed]

FINDINGS: There bilateral moderate pleural effusions. The heart size is enlarged.

The liver demonstrates no focal abnormality. There is no intrahepatic or
extrahepatic biliary ductal dilatation. The gallbladder is surgically
absent. The spleen demonstrates no focal abnormality. The kidneys, left
adrenal gland, and pancreas are normal. There is a 4.4 cm right adrenal mass
with associated coarse calcifications which may represent sequela prior
infection versus hematoma versus adrenal mass. The bladder is unremarkable.

The stomach, duodenum, small intestine, and large intestine demonstrate no
focal abnormality.  There is no pneumoperitoneum, pneumatosis, or portal
venous gas. There is no abdominal or pelvic free fluid. There is no
lymphadenopathy. There is a 3.3 cm mass in the right lower abdomen which may
be related to the right ovary with punctate calcifications.

The abdominal aorta is normal in caliber.

There is a left sacral insufficiency fracture. A there is lumbar spine
spondylosis.
IMPRESSION: 1. Normal caliber aorta.

2.  There is a 4.4 cm right adrenal mass with associated coarse
calcifications which may represent sequela prior infection versus hematoma
versus adrenal mass. The mass is similar in appearance compared with an MRI
of the abdomen dated 12/09/2005.

[DATE]. There is a 3.3 cm mass in the right lower abdomen which may be related to
the right ovary with punctate calcifications.
Recommend further evaluation with a nonemergent pelvic ultrasound.

[REDACTED]

## 2014-08-02 NOTE — Discharge Summary (Signed)
PATIENT NAME:  [facility], [facility] MR#:  [id number] DATE OF BIRTH:  24-Oct-1918  DATE OF ADMISSION:  03/12/2012 DATE OF DISCHARGE:  03/15/2012  DIAGNOSES:  1. Acute cerebrovascular accident. 2. Confusion due to syncopal episode, possibility of cerebrovascular accident.  3. Klebsiella urinary tract infection. 4. Chronic atrial fibrillation. 5. Diastolic congestive heart failure. 6. Hypertension. 7. Diabetes.  8. Hypokalemia.  9. Hypomagnesemia.   DISPOSITION: The patient is being discharged to her independent living facility with home health and PT.   DIET: Low sodium, 1800 calorie ADA diet.   ACTIVITY: As tolerated.   FOLLOW-UP: Follow-up with Dr. Silver HugueninAileen Miller in 1 to 2 weeks after discharge.   DISCHARGE MEDICATIONS:  1. Lipitor 10 mg daily.  2. Lopressor 50 mg b.i.d.  3. Tylenol 1000 mg q.4 hours p.r.n.  4. Calcium with Vitamin D 1 tablet b.i.d.  5. Vitamin D3 2000 international units once a day. 6. Lasix 20 mg daily.  7. Lisinopril 10 mg daily.  8. Multivitamin 1 tablet daily.  9. Zantac 150 mg at bedtime.  10. Xarelto 15 mg daily.  11. Senokot 2 tablets at bedtime as needed for constipation.  12. Ocuvite one capsule once a day.  13. Glimepiride 1 mg once a day.  14. KCl 20 mEq b.i.d.  15. Levaquin 250 mg daily for five days.   RESULTS: CT of the head showed no acute abnormality.   Chest x-ray showed cardiomegaly with small bilateral pleural effusions, cardiomegaly, COPD.  MRI of the brain showed tiny foci of acute ischemia in the left posterior parietal occipital region adjacent to the site of the old stroke.   Carotid ultrasound showed no hemodynamically significant stenosis.   Echo showed EF of 50%, bilateral moderate atrial enlargement, severe MR and TR, moderate AR.   MICROBIOLOGY: Urine culture grew Klebsiella. CBC normal. LDL 35, HDL 85, hemoglobin A1c 8.4. VLDL 18. BNP 7021. Potassium 2.5, normal by the time of discharge. Normal sodium. Normal BUN and  creatinine. Essentially normal cardiac enzymes.   HOSPITAL COURSE: The patient is a 81 year old female who is a resident of an independent living facility and has 24-hour caregivers. Her power-of-attorney is her niece. She also has history of paroxysmal atrial fibrillation. She had a CVA in April of 2013 with resulting occasional aphasia and difficulty finding words. She was brought to the hospital with acute confusion followed by a syncopal episode. This was felt to be due to acute CVA. Her MRI of the brain showed new tiny foci of acute infarct in the left posterior parietal occipital region adjacent to her prior stroke. Additional work-up including carotid ultrasound showed no hemodynamic stenosis. EF was 50% on an echo. She also had bilateral atrial enlargement with moderate AI and severe MR and TR. She is already on Xarelto for anticoagulation and her LDL is at goal. She was found to have Klebsiella urinary tract infection and is being treated with Levaquin. Her heart rate remained well controlled on Lopressor. She had some acute on chronic diastolic CHF which was treated with Lasix. She is currently uncompensated. She had history of diet controlled diabetes, however, her hemoglobin A1c was elevated at 8.4. Therefore, she has been started on low dose glimepiride. She had electrolyte abnormalities including low potassium and magnesium which have been supplemented. She was evaluated by physical therapy during the hospitalization and was found to be at baseline. Home health and physical therapy are being arranged for the patient.  CODE STATUS: DO NOT RESUSCITATE.   CONDITION ON DISCHARGE:  She is being discharged home in stable condition.   TIME SPENT: 45 minutes.   ____________________________ Darrick Meigs, MD sp:drc D: 03/15/2012 11:52:07 ET T: 03/15/2012 14:08:07 ET JOB#: [id number]  cc: Darrick Meigs, MD, <Dictator> Yetta Flock, MD Darrick Meigs MD ELECTRONICALLY SIGNED 03/16/2012  13:18

## 2014-08-02 NOTE — H&P (Signed)
PATIENT NAME:  [facility], Marissa [facility] MR#:  [id number] DATE OF BIRTH:  1918-11-29  DATE OF ADMISSION:  03/12/2012  ADMITTING PHYSICIAN: Marissa Baasadhika Jamaiya Tunnell, MD    PRIMARY MD: Marissa HugueninAileen Miller, MD    CHIEF COMPLAINT: Confusion and syncopal episode.   HISTORY OF PRESENT ILLNESS: Ms. Marissa [facility] is a 81 year old elderly Caucasian female with past medical history significant for atrial fibrillation on Xarelto, history of CVA in April 2013 when she had a left MCA infarct with punctate hemorrhage and she was treated [facility] for the same, history of hypertension, and congestive heart failure who was brought in from [facility] secondary to confusion that started this morning. The patient is at independent living [facility] and has 24-hour caregivers. Currently her niece and also her caregiver, Ms. [facility], are at bedside who provide most of the history. According to them, the patient slid from the bed last night and fell on her bottom, had a mild bruise, did not hit her head. She was fine all night and slept well. This morning she woke up around 8 o'clock and appeared very confused. She was trying to look for her bathroom and she was in front of her bathroom. This is not normal for her. After her stroke she has mild aphasia with word finding difficulty and also short-term memory loss but never confused. After she had been to the bathroom she came back, sat in the chair and didn't move for 45 minutes. Her eyes were open but did not respond. EMS was called. The patient was unresponsive when EMS came. When she was brought to the ER, she is starting to wake up but confused. At the time of my exam, she is more alert and almost close to her baseline. No new weakness noted. No tingling or numbness complaints. Speech is at baseline at this time. Also in the ER, the patient has used the bathroom about 5 to 6 times to urinate which is new. She complained of some cold chills yesterday and also had some suprapubic  discomfort this morning. No other symptoms.   PAST MEDICAL HISTORY:  1. Congestive heart failure, unknown ejection fraction.  2. Atrial fibrillation, on Xarelto.  3. Hypertension.  4. Diabetes mellitus, diet controlled.  5. Ascending aortic aneurysm, monitoring now.  6. History of CVA, left MCA infarct, in April 2013 with aphasia and short term memory loss.  7. Degenerative arthritis.  8. Macular degeneration.   PAST SURGICAL HISTORY:  1. Appendectomy.  2. D and C.  3. The patient had rupture cyst on ovaries for which oophorectomy was done and partial left ovary left in.  4. Bilateral cataract surgery.  5. Bilateral knee replacements.  6. Cholecystectomy.    ALLERGIES TO MEDICATIONS: Penicillin.  HOME MEDICATIONS:  1. Tylenol 1000 mg every four hours p.r.n. for pain.  2. Atorvastatin 10 mg p.o. daily.  3. Calcium Vitamin D 600 mg/200 international units twice a day.  4. Lasix 20 mg p.o. daily.  5. Lisinopril 10 mg p.o. daily.  6. Metoprolol 50 mg p.o. b.i.d.  7. Multivitamin 1 tablet p.o. daily.  8. Ocuvite Lutein capsule daily.  9. Ranitidine 150 mg p.o. daily.  10. Senokot 50/8.6 mg p.o. 2 tablets once a day at bedtime p.r.n. for constipation.  11. Vitamin D3 2000 international units daily.  12. Xarelto 15 mg p.o. daily.   SOCIAL HISTORY: Currently is a [facility] at [facility]. At baseline she walks with a walker but still unsteady and has  had several falls recently.  FAMILY HISTORY: Father had prostate cancer. Mother lived longer, not sure what she died from.  REVIEW OF SYSTEMS: CONSTITUTIONAL: No fever, fatigue, or weakness. EYES: Decreased vision secondary to macular degeneration. No glaucoma. History of cataracts, remote. ENT: No tinnitus. Mild hearing loss. No epistaxis or discharge. RESPIRATORY: No cough, wheeze, or hemoptysis. Has dyspnea on exertion at baseline and uses nocturnal home oxygen. CARDIOVASCULAR: No chest pain, orthopnea, edema,  arrhythmia, or palpitations. Positive for syncope. GI: No nausea, vomiting, abdominal pain, diarrhea, hematemesis, or melena. GU: Polyuria present. No incontinence or dysuria. ENDOCRINE: No thyroid problems, heat or cold intolerance. HEMATOLOGY: No anemia, easy bruising or bleeding. SKIN: No acne, rash, or lesions. MUSCULOSKELETAL: No neck, back, or shoulder pain. Positive for arthritis. NEUROLOGIC: History of CVA and dysarthria present. PSYCHOLOGICAL: No anxiety, insomnia, or depression.   PHYSICAL EXAMINATION:   VITAL SIGNS: Temperature 97.9 degrees Fahrenheit, pulse 88, respirations 18, blood pressure 185/66, pulse oximetry 99% on room air.   GENERAL: Well built, well nourished elderly female lying in bed not in any acute distress.   HEENT: Normocephalic, atraumatic. Pupils right pupil postsurgical and symmetrical. Left pupil is round, sluggishly reacting to light. Anicteric sclerae. Extraocular movements intact. Oropharynx clear without erythema, mass, or exudates.   NECK: Supple. No thyromegaly, JVD, or carotid bruit. No lymphadenopathy.   LUNGS: Moving air bilaterally. No wheeze or rhonchi, bibasilar crackles.   CARDIOVASCULAR: S1, S2 regular rate and rhythm. 3/6 systolic murmur. No rubs or gallops.   ABDOMEN: Soft, nontender, nondistended. No hepatosplenomegaly. Normal bowel sounds.   EXTREMITIES: She has 2 to 3+ edema of bilateral lower extremities below the knee but does have good dorsalis pedis pulses palpable bilaterally.   SKIN: No acne, rash, or lesions.   LYMPHATIC: No cervical lymphadenopathy.   NEUROLOGIC: Cranial nerves appear intact other than aphasia intermittently. Her strength is 5 out of 5 in all four extremities.   PSYCHOLOGICAL: The patient is currently awake, alert, oriented x2.   LABORATORY DATA: WBC 8.9, hemoglobin 14.6, hematocrit 43.3, platelet count 193, sodium 136, potassium 3.8, chloride 96, bicarb 35, BUN 16, creatinine 0.69, glucose 211, calcium 9.4, ALT  33, AST 42, alkaline phosphatase 155, total bilirubin 0.9, albumin 3.3. INR 1.6. CK 116. CK-MB 3.2. Troponin 0.04. BNP is elevated at 7021.   CT of the head showed chronic involutional changes. No evidence of acute abnormalities.   Chest x-ray showing fluid in the minor fissure and mild pleural effusion.  EKG atrial fibrillation, heart rate of 91. No acute ST-T wave abnormalities.   ASSESSMENT AND PLAN: This is a 81 year old female from independent [facility] with 24-hour caregivers with history of atrial fibrillation on Xarelto, prior history of CVA, hypertension, diabetes, and CHF who was brought in for confusion followed by syncopal episode.  1. Acute onset of confusion followed by syncopal episode. Now the patient is recovering and almost close to her baseline. Could be neurogenic cause of syncope like TIA or could be cardiogenic syncope. Will admit to telemetry. Get neuro checks, MRI of the brain, echo, and carotid Doppler's. Continue Xarelto and statins. Speech and physical therapy consults have been ordered.  2. Chronic atrial fibrillation, rate well controlled. Continue metoprolol. She is on Xarelto for anticoagulation.  3. Congestive heart failure with mild exacerbation on chest x-ray, on nocturnal home oxygen, worsening pedal edema and fluid in minor fissure and mild right pleural effusion on chest x-ray. Will give one dose of IV Lasix and then continue her oral Lasix.  Continue cardiac medications.  4. Hypertension. Continue metoprolol and lisinopril.  5. Diet controlled diabetes mellitus. Follow-up HbA1c and continue on sliding scale insulin. 6. GI and DVT prophylaxis. On Protonix and also Xarelto.   CODE STATUS: DO NOT RESUSCITATE. The patient has an out-of-facility DO NOT RESUSCITATE form signed and placed in the chart.  TIME SPENT ON ADMISSION: 50 minutes.  ____________________________ Marissa Baas, MD rk:drc D: 03/12/2012 14:52:43 ET T: 03/12/2012 15:52:37  ET JOB#: [id number] cc: Marissa Baas, MD, <Dictator>, Yetta Flock, MD Marissa Baas MD ELECTRONICALLY SIGNED 03/12/2012 18:01

## 2014-08-06 NOTE — Discharge Summary (Signed)
PATIENT NAME:  [facility], [facility] DATE OF BIRTH:  May 19, 1918  DATE OF ADMISSION:  09/20/2013 DATE OF DISCHARGE:  09/21/2013  ADMITTING PHYSICIAN:  Dr. Angelica Ranavid Hower   DISCHARGING PHYSICIAN:  Dr. Enid Baasadhika Jovaughn Wojtaszek  PRIMARY CARE PHYSICIAN: Dr. [facility]ile[facility] at [facility].   CONSULTATIONS IN THE HOSPITAL: None.   DISCHARGE DIAGNOSES:  1. Altered mental status secondary to metabolic encephalopathy, likely from steroids, high dose of prednisone.  2. Recent bronchitis.  3. Vascular dementia.  4. Hypertension.  5. Non-insulin-dependent diabetes mellitus.  6. History of cerebrovascular accidents and transient ischemic attacks.  7. Atrial fibrillation.  8. Chronic congestive heart failure.   DISCHARGE HOME MEDICATIONS:  1. Atorvastatin 10 mg p.o. daily.  2. Tylenol 1000 milligrams q. 4 hours p.r.n. for pain.  3. Calcium vitamin D 600 mg/200 international units 1 tablet p.o. b.i.d.  4. Vitamin D3 of 2000 international units daily.  5. Multivitamin 1 tablet p.o. daily.  6. Ranitidine 150 mg via.  7. Klor-Con 20 mEq p.o. b.i.d.  8.  Glimiperide 2 mg daily.  9. Lasix 40 mg p.o. daily.  10. Lisinopril 20 mg p.o. daily.  11. Ocuvite 1 capsule p.o. b.i.d.  12. Senokot 1 capsule p.o. at bedtime.  13. Trazodone 50 mg 1.5 tablets at bedtime. 14. Xarelto 20 mg p.o. daily.  15. Lopressor 50 mg p.o. b.i.d.  16. Namenda 10 mg p.o. daily.  17. Haldol 1 mg p.o. q. 8 hours.  18. Levaquin 250 mg q. 24 hours.  19. Prednisone taper over the next 3 days.   DISCHARGE DIET: Low sodium.   DISCHARGE ACTIVITY: As tolerated.    FOLLOWUP INSTRUCTIONS:  PCP followup in 1 week.   LABORATORY AND IMAGING STUDIES PRIOR TO DISCHARGE: LDL cholesterol 36, HDL 71 , total cholesterol 127, triglycerides 99, and urinalysis negative for any infection. Chest x-ray showing cardiomegaly, no acute cardiovascular disease. CT of the head showing no acute intracranial abnormality, chronic changes noted.    WBC 6.1, hemoglobin 13.3, hematocrit 41.9, platelet count 169,000.   Sodium 135, potassium 4.3, chloride 99, bicarbonate 29, BUN 26, creatinine 1.15, glucose 294, and calcium of 10.1.   ALT 24, AST 34, alkaline phosphatase 91, total bilirubin 0.4, albumin of 3.3. Troponins negative. TSH 1.5.   BRIEF HOSPITAL COURSE: This patient is a pleasant 81 year old Caucasian female with past medical history significant for TIAs, CVAs, history of vascular dementia, hypertension, atrial fibrillation on Xarelto, and diabetes presents from home secondary to confusion.  1. Altered mental status, likely metabolic encephalopathy.  According to caregiver, patient was recently started on 60 mg of prednisone taper and also Levaquin for possible bronchitis, said that started after a choking episode. First couple of days of prednisone,  patient was fine. However, became more combative, agitated, so was brought in here.  She was scheduled to get an MRI done; however, patient was not cooperating initially so they had to cancel the MRI. I did not feel any need to repeat the MRI because she does not have any focal neurological changes. She is back to her baseline. Prednisone dose has been reduced to a slow taper.  It was explained to the caregiver and she was agreeable that the patient does not need an MRI at this time.  2. Atrial fibrillation.  Rate was well controlled. The patient is on Xarelto which she will continue and she is also on metoprolol for rate control.  3. Recent acute bronchitis. The patient on prednisone slow taper now and  also on Levaquin and she is stable.  4. Vascular dementia. The patient appears to be at baseline, recognizing family, interactive, and pleasant. She is on Haldol, Namenda, trazodone.   5. Her course has been otherwise uneventful in the hospital.   DISCHARGE CONDITION: Stable.   DISCHARGE DISPOSITION: To assisted living facility.   TIME SPENT ON DISCHARGE: 40 minutes.     ____________________________ Enid Baas, MD rk:dd D: 09/22/2013 14:34:59 ET T: 09/22/2013 19:16:58 ET JOB#: [id number]  cc: Enid Baas, MD, <Dictator> Yetta Flock, MD  Enid Baas MD ELECTRONICALLY SIGNED 09/25/2013 15:38

## 2014-08-06 NOTE — H&P (Signed)
PATIENT NAME:  Marissa Sanford, [name] MR#:  [id number] DATE OF BIRTH:  1919-02-11  DATE OF ADMISSION:  09/20/2013  REFERRING PHYSICIAN: Dr. Darnelle CatalanMalinda.   PRIMARY CARE PHYSICIAN: Rockie NeighboursEileen Miller, [facility].   CHIEF COMPLAINT: Altered mental status.   HISTORY OF PRESENT ILLNESS: This is a 81 year old Caucasian female with a history of dementia, as well as multiple strokes, presenting with altered mental status. She is unable to provide any meaningful information, given baseline mental status. However, per nursing facility staff, who are at bedside, the awoke the patient in the evening and noticed her to have slurred speech, which was transient. She was also harder to arouse than usual. She is now back to baseline, which is demented and confused. Of note, they believe that three days ago she had an episode of aspiration. She was started on a steroid taper by her PCP as well as antibiotics. Currently on of her steroid taper, she is receiving 30 mg prednisone a day, to continue to taper at 30 mg for two days, and then decrease 10 mg every two days. Antibiotic coverage with Avelox. No cough, shortness of breath, or further symptomatology is reported by nursing facility staff.  REVIEW OF SYSTEMS: Unobtainable, given patient's baseline mental status.   PAST MEDICAL HISTORY: Atrial fibrillation on Xarelto, hypertension, history of AAA, diabetes, diet controlled, CVA with residual aphasia, likely vascular dementia.   SOCIAL HISTORY: No documented use of alcohol, tobacco or drug use. Uses a walker for ambulation. She is, at baseline, confused and apparently at least 75% of the time.   FAMILY HISTORY: No documented cardiovascular or pulmonary illnesses.   ALLERGIES: PENICILLIN.   HOME MEDICATIONS: Include acetaminophen 500 mg 2 tabs p.o. q.4h. as needed for pain, lisinopril 20 mg p.o. daily, Xarelto 20 mg p.o. daily, trazodone 50 mg 1-1/2 tablets p.o. at bedtime as needed at nighttime, Glimepiride 2 mg p.o. daily,  atorvastatin 10 mg p.o. daily, haloperidol 1 mg p.o. q.8h., Lopressor 50 mg p.o. b.i.d., Lasix 40 mg p.o. daily, ranitidine 150 mg p.o. at bedtime, Senna-S 50/8.6, 1 tablet every day as needed for constipation, potassium 20 mEq p.o. b.i.d., Namenda 10 mg p.o. daily, Levaquin 250 mg p.o. daily, calcium 600 mg plus vitamin D 200 international units p.o. b.i.d., multivitamin 1 tab p.o. daily, vitamin D3 2000 international units p.o. daily.   PHYSICAL EXAMINATION: VITAL SIGNS: Temperature 97.9, heart rate 77, respirations 24, blood pressure 134/61, saturating 97% on room air. Weight 70.8 kg, BMI 25.2.  GENERAL: Chronically weak-appearing Caucasian female, currently in no acute distress.  HEAD: Normocephalic, atraumatic.  EYES: Pupils equal, round, and reactive to light. Extraocular muscles intact. No scleral icterus.  MOUTH: Moist mucosal membrane. Dentition intact. No abscess noted.  EARS, NOSE, AND THROAT: Clear, without exudates. No external lesions.  NECK: Supple. No thyromegaly. No nodules. No JVD.  PULMONARY: Clear to auscultation bilaterally, without wheezes, rubs or rhonchi. No use of accessory muscles. Good respiratory effort. Chest nontender to palpation.  CARDIOVASCULAR: S1, S2. Irregular rate, irregular rhythm. No murmurs, rubs, or gallops. 1+ edema of lower extremities bilaterally. Pedal pulses 2+ bilaterally.  GASTROINTESTINAL: Soft, nontender, nondistended. No masses. Positive bowel sounds. No hepatosplenomegaly.  MUSCULOSKELETAL: No swelling, clubbing. Positive for edema as described above. Range of motion full in all extremities.  NEUROLOGIC: Cranial nerves II through XII intact. No gross focal neurological deficits. Sensation intact. Reflexes intact.  SKIN: No ulceration, lesions, rash, or cyanosis. Skin warm and dry. Turgor intact.  PSYCHIATRIC: Mood and affect blunted. She is  awake, oriented to person. She is able to follow simple commands, answer yes-or-no questions. However, that is  really the extent of her conversation abilities. Insight and judgment are poor at this time.   LABORATORY DATA: EKG on arrival reveals atrial fibrillation, heart rate of 126. Remainder of radiographic imaging: CT head performed, no acute intracranial process. Chest x-ray performed, no acute cardiopulmonary process. Remainder of laboratory data: Sodium 135, potassium 4.3, chloride 99, bicarbonate 29, BUN 26, creatinine 1.15, glucose 294. LFTs within normal limits. WBC 6.1, hemoglobin 13.5, platelets 169.   ASSESSMENT AND PLAN: A 81 year old Caucasian female with history of dementia, as well as multiple cerebrovascular accidents, presenting with altered mental status, though she is apparently now back to her baseline, which is demented and confused.   1.  Encephalopathy, unclear etiology. No evidence of infection at this time. Possible transient ischemic attack. Discussed the utility of MRI and how test results would likely not change outcome. However, despite this, family agreed to go forward with MRI testing. Will initiate aspirin and statin therapy.  2.  Recent aspiration. No evidence of infiltrate on chest x-ray. Will continue her antibiotic and steroid course as has already been started by her PCP. As far as steroids, will currently be 30 mg p.o. daily, and then decrease by 10 mg every other day, though steroids are also a possible confounding factor for her encephalopathy.  3.  Hyperlipidemia, on statin therapy.  4.  Dementia. Continue with Namenda.  5.  Atrial fibrillation. Continue with Xarelto, though initiated discussion about utility of Xarelto in a patient who is confused and uses a walker for ambulation at baseline.   6.  Venous thromboembolism prophylaxis with Xarelto.  CODE STATUS: The patient is DNR.  TIME SPENT: 45 minutes.   ____________________________ Cletis Athens. Hower, MD dkh:cg D: 09/20/2013 23:19:25 ET T: 09/21/2013 00:24:48 ET JOB#: [id number]  cc: Cletis Athens. Hower, MD,  <Dictator> DAVID Synetta Shadow MD ELECTRONICALLY SIGNED 09/21/2013 21:05

## 2015-08-27 ENCOUNTER — Emergency Department: Payer: Medicare Other

## 2015-08-27 ENCOUNTER — Emergency Department
Admission: EM | Admit: 2015-08-27 | Discharge: 2015-08-27 | Disposition: A | Discharge status: Home / Self Care | Payer: Medicare Other | Attending: Emergency Medicine | Admitting: Emergency Medicine

## 2015-08-27 DIAGNOSIS — Z880 Allergy status to penicillin: Secondary | ICD-10-CM | POA: Insufficient documentation

## 2015-08-27 DIAGNOSIS — N390 Urinary tract infection, site not specified: Secondary | ICD-10-CM | POA: Diagnosis not present

## 2015-08-27 DIAGNOSIS — R4182 Altered mental status, unspecified: Secondary | ICD-10-CM | POA: Diagnosis present

## 2015-08-27 LAB — COMPREHENSIVE METABOLIC PANEL
ALBUMIN: 3.3 g/dL — AB (ref 3.5–5.0)
ALK PHOS: 71 U/L (ref 38–126)
ALT: 11 U/L — AB (ref 14–54)
ANION GAP: 5 (ref 5–15)
AST: 19 U/L (ref 15–41)
BILIRUBIN TOTAL: 0.5 mg/dL (ref 0.3–1.2)
BUN: 22 mg/dL — ABNORMAL HIGH (ref 6–20)
CO2: 36 mmol/L — ABNORMAL HIGH (ref 22–32)
Calcium: 9.9 mg/dL (ref 8.9–10.3)
Chloride: 101 mmol/L (ref 101–111)
Creatinine, Ser: 0.72 mg/dL (ref 0.44–1.00)
GFR calc Af Amer: >60 mL/min (ref >60)
GFR calc non Af Amer: >60 mL/min (ref >60)
GLUCOSE: 152 mg/dL — AB (ref 65–99)
POTASSIUM: 4 mmol/L (ref 3.5–5.1)
SODIUM: 142 mmol/L (ref 135–145)
TOTAL PROTEIN: 6.1 g/dL — AB (ref 6.5–8.1)

## 2015-08-27 LAB — URINALYSIS COMPLETE WITH MICROSCOPIC (ARMC ONLY)
BILIRUBIN URINE: NEGATIVE
GLUCOSE, UA: NEGATIVE mg/dL
Ketones, ur: NEGATIVE mg/dL
NITRITE: POSITIVE — AB
Protein, ur: NEGATIVE mg/dL
Specific Gravity, Urine: 1.009 (ref 1.005–1.030)
Squamous Epithelial / LPF: NONE SEEN
pH: 6 (ref 5.0–8.0)

## 2015-08-27 LAB — CBC WITH DIFFERENTIAL/PLATELET
BASOS ABS: 0 10*3/uL (ref 0–0.1)
Basophils Relative: 1 %
Eosinophils Absolute: 0.3 10*3/uL (ref 0–0.7)
Eosinophils Relative: 5 %
HEMATOCRIT: 35 % (ref 35.0–47.0)
Hemoglobin: 11.7 g/dL — ABNORMAL LOW (ref 12.0–16.0)
LYMPHS PCT: 18 %
Lymphs Abs: 1.1 10*3/uL (ref 1.0–3.6)
MCH: 32.3 pg (ref 26.0–34.0)
MCHC: 33.3 g/dL (ref 32.0–36.0)
MCV: 97 fL (ref 80.0–100.0)
MONO ABS: 0.6 10*3/uL (ref 0.2–0.9)
Monocytes Relative: 10 %
NEUTROS ABS: 4.1 10*3/uL (ref 1.4–6.5)
Neutrophils Relative %: 66 %
Platelets: 146 10*3/uL — ABNORMAL LOW (ref 150–440)
RBC: 3.61 MIL/uL — AB (ref 3.80–5.20)
RDW: 14.9 % — ABNORMAL HIGH (ref 11.5–14.5)
WBC: 6.2 10*3/uL (ref 3.6–11.0)

## 2015-08-27 LAB — TROPONIN I: Troponin I: 0.03 ng/mL (ref <0.031)

## 2015-08-27 MED ORDER — DEXTROSE 5 % IV SOLN
1.0000 g | Freq: Once | INTRAVENOUS | Status: AC
  Administered 2015-08-27: 1 g via INTRAVENOUS
  Filled 2015-08-27: qty 10

## 2015-08-27 MED ORDER — CIPROFLOXACIN HCL 500 MG PO TABS: 500.0000 mg | ORAL_TABLET | Freq: Two times a day (BID) | ORAL | Status: AC

## 2015-08-27 NOTE — ED Notes (Signed)
Pt arrived via EMS from home for altered mental status. Pt has a 24 hour caregiver and they report that pt has not been acting herself today. Pt has a normal routine that she didn't follow this morning. Caregiver reports decreased PO intake and decreased urine output. Vital signs WNL. Pt oriented to self.

## 2015-08-27 NOTE — ED Notes (Signed)
Discharge instructions reviewed with patient's niece who is patient's healthcare power of attorney and one of patient's caregiver's. Patient's niece verbalized understanding. Patient transferred to EMS stretcher and transported back home via EMS due to being completely bed bound and being unable to sit in chair.

## 2015-08-27 NOTE — ED Provider Notes (Signed)
Time Seen: Approximately *1910 I have reviewed the triage notes  Chief Complaint: Altered Mental Status   History of Present Illness: Marissa Sanford is a 82 y.o. female *who presents from home via EMS for altered mental status. The patient has a history of strokes etc. is not a very good historian. 24-hour caretaker states patient just hasn't been herself today. She is as oriented as normal but just doesn't seem to be as interactive with some lethargy. No obvious fever at home. They did notice that her heart rate seemed to be irregular. She has no history of atrial fibrillation. The patient herself is oriented times one she is cooperative. She is able to answer some questions appropriately. She denies any current pain.   No past medical history on file.  There are no active problems to display for this patient.   No past surgical history on file.  No past surgical history on file.  Current Outpatient Rx  Name  Route  Sig  Dispense  Refill  . ciprofloxacin (CIPRO) 500 MG tablet   Oral   Take 1 tablet (500 mg total) by mouth 2 (two) times daily.   14 tablet   0     Allergies:  Penicillins  Family History: No family history on file.  Social History: Social History  Substance Use Topics  . Smoking status: Not on file  . Smokeless tobacco: Not on file  . Alcohol Use: Not on file     Review of Systems:   10 point review of systems was performed and was otherwise negative:  Constitutional: No fever Eyes: No visual disturbances ENT: No sore throat, ear pain Cardiac: No chest pain Respiratory: No shortness of breath, wheezing, or stridor Abdomen: No abdominal pain, no vomiting, No diarrhea Endocrine: No weight loss, No night sweats Extremities: No peripheral edema, cyanosis Skin: No rashes, easy bruising Neurologic: No focal weakness, trouble with speech or swollowing Urologic: No dysuria, Hematuria, or urinary frequency   Physical Exam:  ED Triage Vitals  Enc  Vitals Group     BP 08/27/15 1843 138/60 mmHg     Pulse Rate 08/27/15 1843 82     Resp 08/27/15 1843 19     Temp 08/27/15 1843 98.4 F (36.9 C)     Temp Source 08/27/15 1843 Rectal     SpO2 08/27/15 1843 100 %     Weight 08/27/15 1843 172 lb 6.4 oz (78.2 kg)     Height 08/27/15 1843 5\' 7"  (1.702 m)     Head Cir --      Peak Flow --      Pain Score --      Pain Loc --      Pain Edu? --      Excl. in GC? --     General: Awake , Alert , and Oriented times 1 Head: Normal cephalic , atraumatic Eyes: Pupils equal , round, reactive to light Nose/Throat: No nasal drainage, patent upper airway without erythema or exudate.  Neck: Supple, Full range of motion, No anterior adenopathy or palpable thyroid masses Lungs: Clear to ascultation without wheezes , rhonchi, or rales Heart: Regular rate, irregular rhythm without murmurs , gallops , or rubs Abdomen: Soft, non tender without rebound, guarding , or rigidity; bowel sounds positive and symmetric in all 4 quadrants. No organomegaly .        Extremities: 2 plus symmetric pulses. No edema, clubbing or cyanosis Neurologic: normal ambulation, Motor symmetric without deficits, sensory intact Skin:  warm, dry, no rashes   Labs:   All laboratory work was reviewed including any pertinent negatives or positives listed below:  Labs Reviewed  URINALYSIS COMPLETEWITH MICROSCOPIC ([facility] ONLY) - Abnormal; Notable for the following:    Color, Urine YELLOW (*)    APPearance CLOUDY (*)    Hgb urine dipstick 1+ (*)    Nitrite POSITIVE (*)    Leukocytes, UA 2+ (*)    Bacteria, UA MANY (*)    All other components within normal limits  COMPREHENSIVE METABOLIC PANEL - Abnormal; Notable for the following:    CO2 36 (*)    Glucose, Bld 152 (*)    BUN 22 (*)    Total Protein 6.1 (*)    Albumin 3.3 (*)    ALT 11 (*)    All other components within normal limits  CBC WITH DIFFERENTIAL/PLATELET - Abnormal; Notable for the following:    RBC 3.61 (*)     Hemoglobin 11.7 (*)    RDW 14.9 (*)    Platelets 146 (*)    All other components within normal limits  URINE CULTURE  TROPONIN I  Laboratory work shows findings consistent with a urinary tract infection.  EKG: ED ECG REPORT I, Jennye MoccasinBrian S Khyan Oats, the attending physician, personally viewed and interpreted this ECG.  Date: 08/27/2015 EKG Time: 1838 Rate: 78 Rhythm: Atrial fibrillation QRS Axis: normal Intervals: normal ST/T Wave abnormalities: normal Conduction Disturbances: none Narrative Interpretation: unremarkable No acute ischemic changes   Radiology:   EXAM: CHEST 2 VIEW  COMPARISON: 09/20/2013  FINDINGS: There is no focal parenchymal opacity. There is a small left pleural effusion. There is no right pleural effusion. There is no pneumothorax. There is stable cardiomegaly.  The osseous structures are unremarkable.  IMPRESSION: Stable cardiomegaly.  Small left pleural effusion.   Electronically Signed By: Elige KoHetal Patel On: 08/27/2015 20:22          CT Head Wo Contrast (Final result) Result time: 08/27/15 19:54:10   Final result by Rad Results In Interface (08/27/15 19:54:10)   Narrative:   CLINICAL DATA: Altered mental status  EXAM: CT HEAD WITHOUT CONTRAST  TECHNIQUE: Contiguous axial images were obtained from the base of the skull through the vertex without intravenous contrast.  COMPARISON: 01/10/2014  FINDINGS: There is no evidence of mass effect, midline shift, or extra-axial fluid collections. There is no evidence of a space-occupying lesion or intracranial hemorrhage. There is no evidence of a cortical-based area of acute infarction. There is generalized cerebral atrophy. There is periventricular white matter low attenuation likely secondary to microangiopathy. There is left parietal encephalomalacia.  The ventricles and sulci are appropriate for the patient's age. The basal cisterns are patent.  Visualized portions of  the orbits are unremarkable. The visualized portions of the paranasal sinuses and mastoid air cells are unremarkable. Cerebrovascular atherosclerotic calcifications are noted.  The osseous structures are unremarkable.  IMPRESSION: 1. No acute intracranial pathology. 2. Chronic microvascular disease and cerebral atrophy.   Electronically Signed By: Elige KoHetal Patel On: 08/27/2015 19:54         I personally reviewed the radiologic studies   P ED Course:  Patient's stay here showed some mild symptomatic improvement reexamination shows her little bit more awake and alert and interactive. She did not seem to have any significant lethargy. The patient was found to have a urinary tract infection and what could be new onset atrial fibrillation. She currently is on blood thinners with a alto and also beta blocker therapy and her  atrial fibrillation even notes a new finding seems to be rate controlled and I felt to be no further benefit with inpatient management. Patient does have a DO NOT RESUSCITATE as well cared for at home as family and caretakers are here with her. She normally does have some lethargy with her urinary tract infections. They state that Cipro normally as the antibiotic of choice for her with urinary tract infections. She was given IV Rocephin here in emergency department and will be discharged with prescription for Cipro. Urine culture is pending.    Assessment:  Altered mental status Urinary tract infection New-onset atrial fibrillation  Final Clinical Impression:  Final diagnoses:  Acute urinary tract infection     Plan:  Outpatient management Patient was advised to return immediately if condition worsens. Patient was advised to follow up with their primary care physician or other specialized physicians involved in their outpatient care. The patient and/or family member/power of attorney had laboratory results reviewed at the bedside. All questions and  concerns were addressed and appropriate discharge instructions were distributed by the nursing staff.             Jennye Moccasin, MD 08/27/15 2108

## 2015-08-27 NOTE — Discharge Instructions (Signed)
Urinary Tract Infection °A urinary tract infection (UTI) can occur any place along the urinary tract. The tract includes the kidneys, ureters, bladder, and urethra. A type of germ called bacteria often causes a UTI. UTIs are often helped with antibiotic medicine.  °HOME CARE  °· If given, take antibiotics as told by your doctor. Finish them even if you start to feel better. °· Drink enough fluids to keep your pee (urine) clear or pale yellow. °· Avoid tea, drinks with caffeine, and bubbly (carbonated) drinks. °· Pee often. Avoid holding your pee in for a long time. °· Pee before and after having sex (intercourse). °· Wipe from front to back after you poop (bowel movement) if you are a woman. Use each tissue only once. °GET HELP RIGHT AWAY IF:  °· You have back pain. °· You have lower belly (abdominal) pain. °· You have chills. °· You feel sick to your stomach (nauseous). °· You throw up (vomit). °· Your burning or discomfort with peeing does not go away. °· You have a fever. °· Your symptoms are not better in 3 days. °MAKE SURE YOU:  °· Understand these instructions. °· Will watch your condition. °· Will get help right away if you are not doing well or get worse. °  °This information is not intended to replace advice given to you by your health care provider. Make sure you discuss any questions you have with your health care provider. °  °Document Released: 09/18/2007 Document Revised: 04/22/2014 Document Reviewed: 10/31/2011 °Elsevier Interactive Patient Education ©2016 [employer] ° °Please return immediately if condition worsens. Please contact her primary physician or the physician you were given for referral. If you have any specialist physicians involved in her treatment and plan please also contact them. Thank you for using [facility]. ° °

## 2015-08-29 LAB — URINE CULTURE: Culture: NO GROWTH

## 2016-01-04 DIAGNOSIS — F39 Unspecified mood [affective] disorder: Secondary | ICD-10-CM

## 2016-01-04 DIAGNOSIS — J9611 Chronic respiratory failure with hypoxia: Secondary | ICD-10-CM

## 2016-01-04 DIAGNOSIS — F0280 Dementia in other diseases classified elsewhere without behavioral disturbance: Secondary | ICD-10-CM | POA: Diagnosis not present

## 2016-01-04 DIAGNOSIS — E119 Type 2 diabetes mellitus without complications: Secondary | ICD-10-CM

## 2016-01-04 DIAGNOSIS — I5032 Chronic diastolic (congestive) heart failure: Secondary | ICD-10-CM

## 2016-01-04 DIAGNOSIS — I69320 Aphasia following cerebral infarction: Secondary | ICD-10-CM

## 2016-01-04 DIAGNOSIS — G308 Other Alzheimer's disease: Secondary | ICD-10-CM

## 2016-01-29 DIAGNOSIS — I482 Chronic atrial fibrillation: Secondary | ICD-10-CM

## 2016-01-29 DIAGNOSIS — F39 Unspecified mood [affective] disorder: Secondary | ICD-10-CM

## 2016-01-29 DIAGNOSIS — E119 Type 2 diabetes mellitus without complications: Secondary | ICD-10-CM | POA: Diagnosis not present

## 2016-01-29 DIAGNOSIS — I69398 Other sequelae of cerebral infarction: Secondary | ICD-10-CM | POA: Diagnosis not present

## 2016-01-29 DIAGNOSIS — G308 Other Alzheimer's disease: Secondary | ICD-10-CM

## 2016-01-29 DIAGNOSIS — J9611 Chronic respiratory failure with hypoxia: Secondary | ICD-10-CM

## 2016-01-29 DIAGNOSIS — F0280 Dementia in other diseases classified elsewhere without behavioral disturbance: Secondary | ICD-10-CM

## 2016-02-19 DIAGNOSIS — R739 Hyperglycemia, unspecified: Secondary | ICD-10-CM

## 2016-03-01 DIAGNOSIS — I69398 Other sequelae of cerebral infarction: Secondary | ICD-10-CM

## 2016-03-01 DIAGNOSIS — F39 Unspecified mood [affective] disorder: Secondary | ICD-10-CM

## 2016-03-01 DIAGNOSIS — G309 Alzheimer's disease, unspecified: Secondary | ICD-10-CM

## 2016-03-01 DIAGNOSIS — E43 Unspecified severe protein-calorie malnutrition: Secondary | ICD-10-CM

## 2016-03-01 DIAGNOSIS — I4891 Unspecified atrial fibrillation: Secondary | ICD-10-CM

## 2016-03-01 DIAGNOSIS — E1140 Type 2 diabetes mellitus with diabetic neuropathy, unspecified: Secondary | ICD-10-CM

## 2016-03-01 DIAGNOSIS — I5030 Unspecified diastolic (congestive) heart failure: Secondary | ICD-10-CM

## 2016-03-01 DIAGNOSIS — J9611 Chronic respiratory failure with hypoxia: Secondary | ICD-10-CM

## 2016-03-13 DIAGNOSIS — R1310 Dysphagia, unspecified: Secondary | ICD-10-CM | POA: Diagnosis not present

## 2016-03-18 ENCOUNTER — Telehealth: Payer: Self-pay

## 2016-03-18 NOTE — Telephone Encounter (Signed)
Received a call from [facility] from the weekend reporting a death announcement on patient by nursing home to Dr. Fabian SharpPanosh (on call) physician.  Dr. Alphonsus SiasLetvak reports following patient in recent months while in nursing home and has been made aware of patient passing.

## 2016-03-18 NOTE — Telephone Encounter (Signed)
I was already aware She had been worsening and hospice was engaged--but didn't have time to really be involved

## 2016-04-15 DEATH — deceased

## 2017-11-25 IMAGING — CT CT HEAD W/O CM
1 series · 15 of 30 positions shown, 19 images · non-contrast
Comparison: 01/10/2014

CLINICAL DATA: Altered mental status

EXAM:
CT HEAD WITHOUT CONTRAST
TECHNIQUE: Contiguous axial images were obtained from the base of the skull
through the vertex without intravenous contrast.

[Series 2: head wo · axial · 0.47mm/px · z∈[-131,+2]mm · 15 of 30 slices shown, 19 images]
[im 2/30  brain]
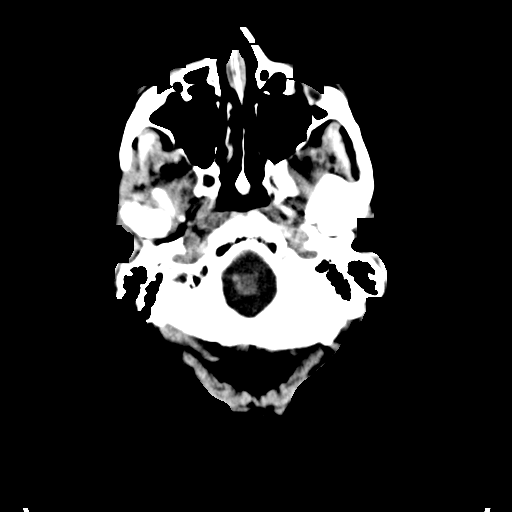
[im 2/30  bone]
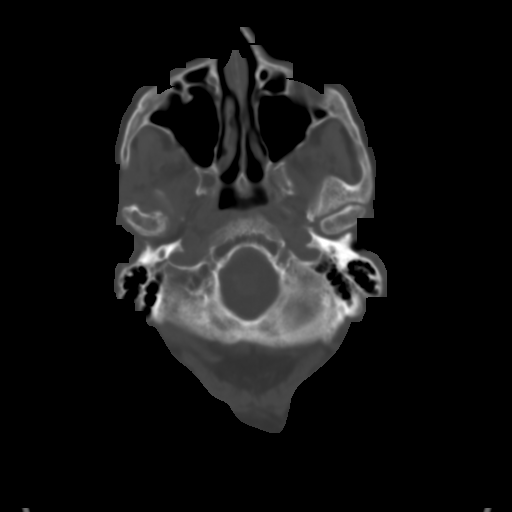
[im 4/30  brain]
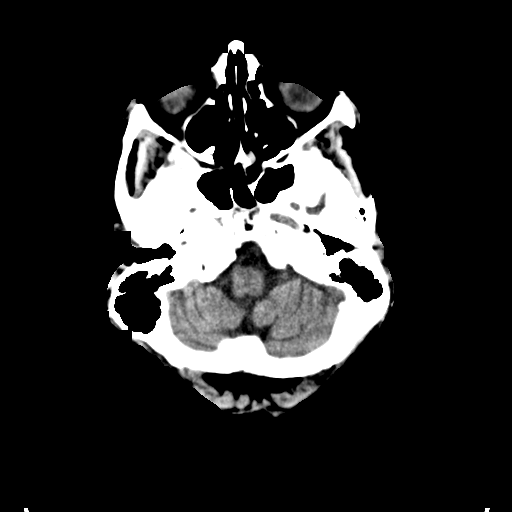
[im 6/30  brain]
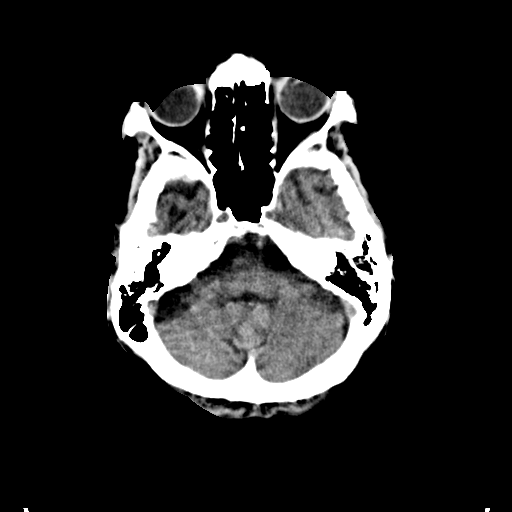
[im 8/30  brain]
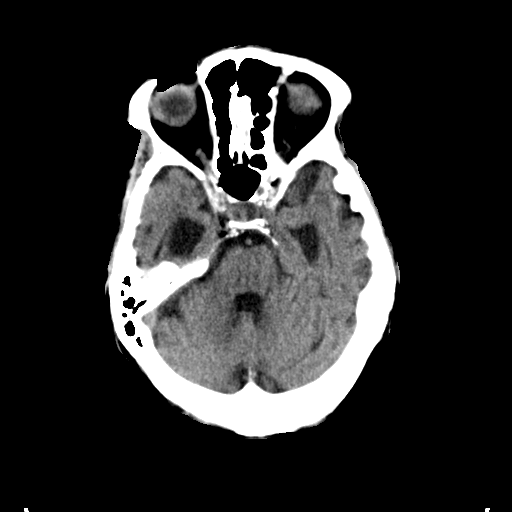
[im 10/30  brain]
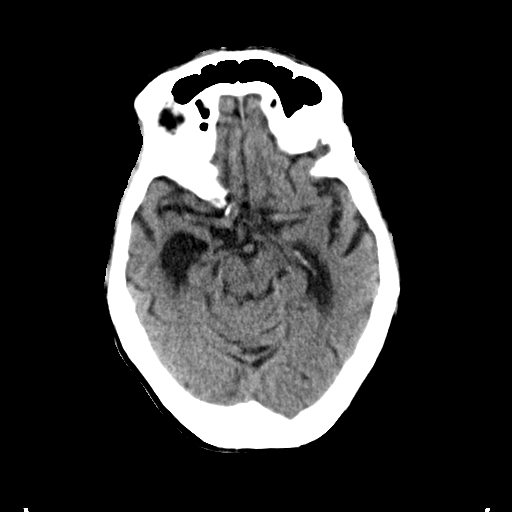
[im 10/30  bone]
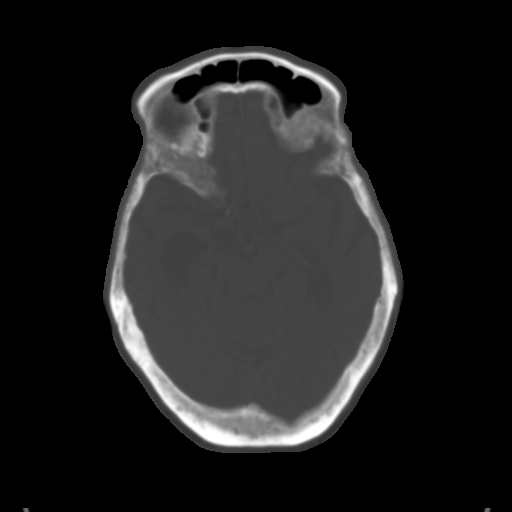
[im 12/30  brain]
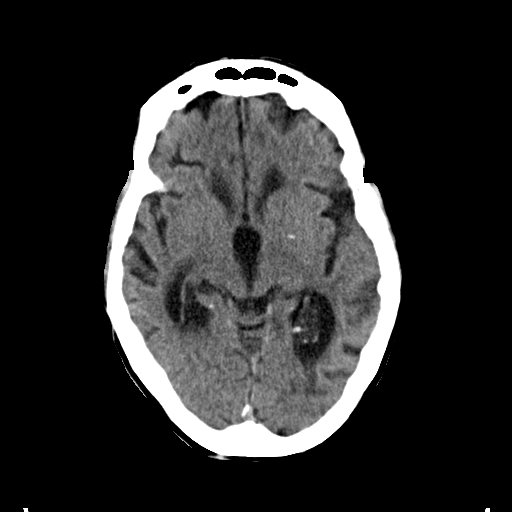
[im 14/30  brain]
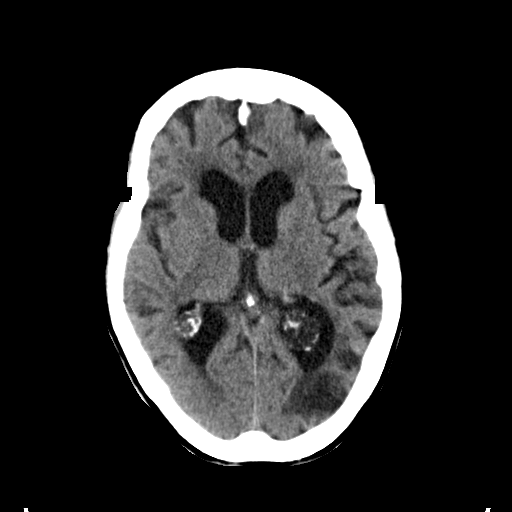
[im 16/30  brain]
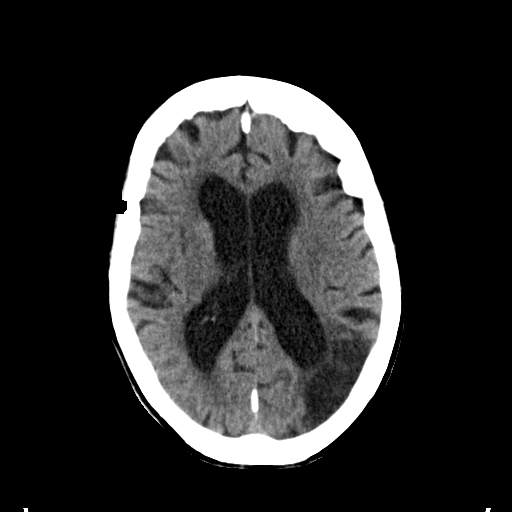
[im 17/30  brain]
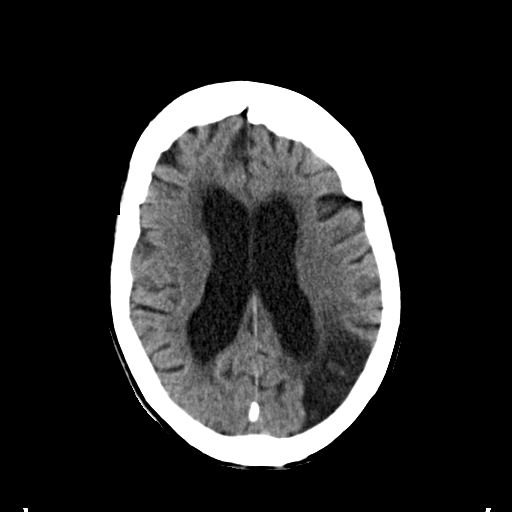
[im 17/30  bone]
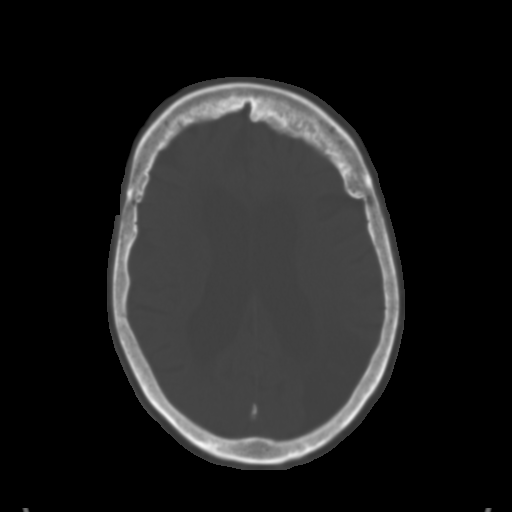
[im 19/30  brain]
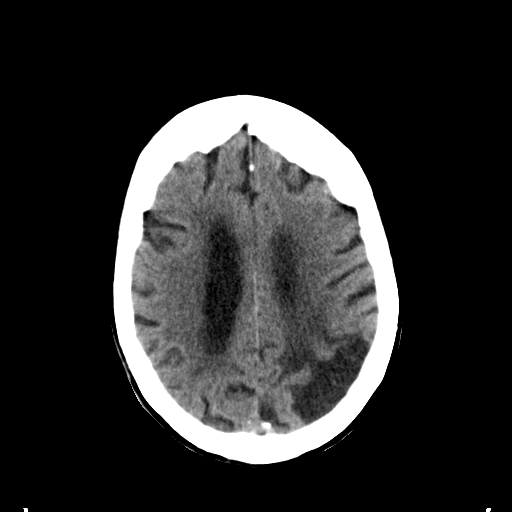
[im 21/30  brain]
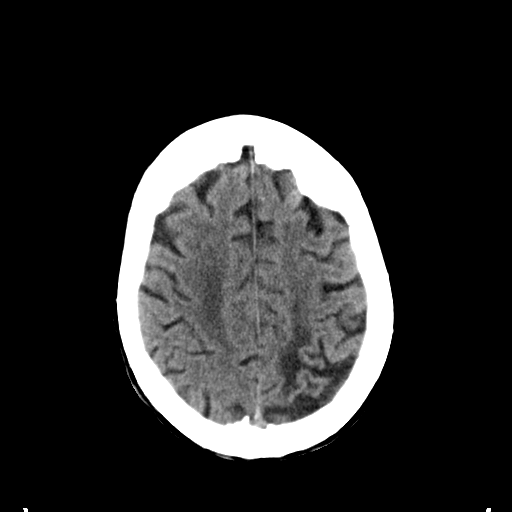
[im 23/30  brain]
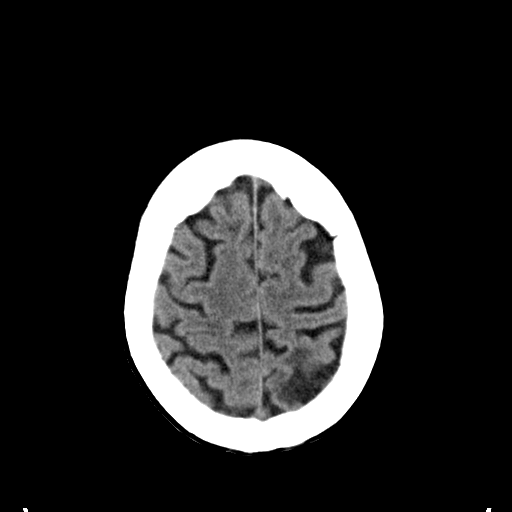
[im 25/30  brain]
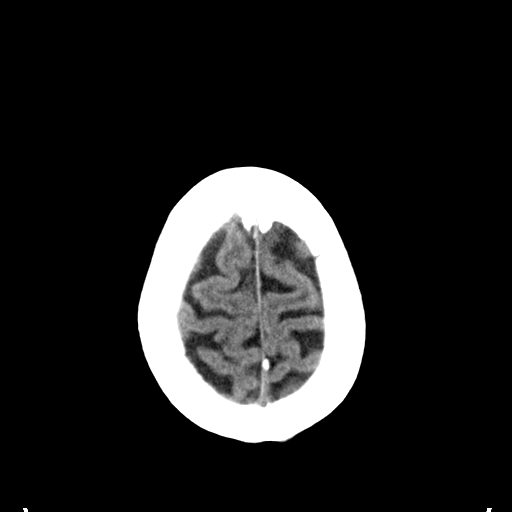
[im 25/30  bone]
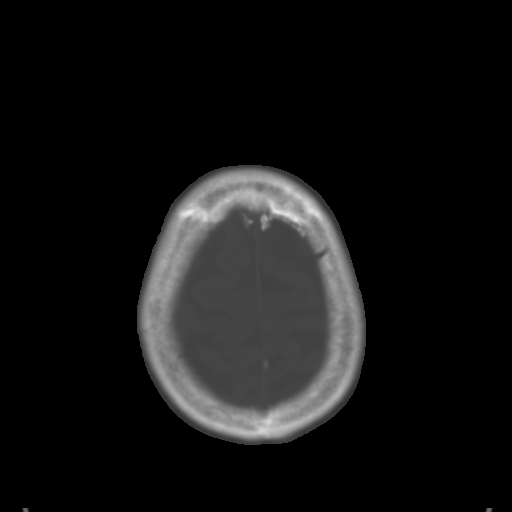
[im 27/30  brain]
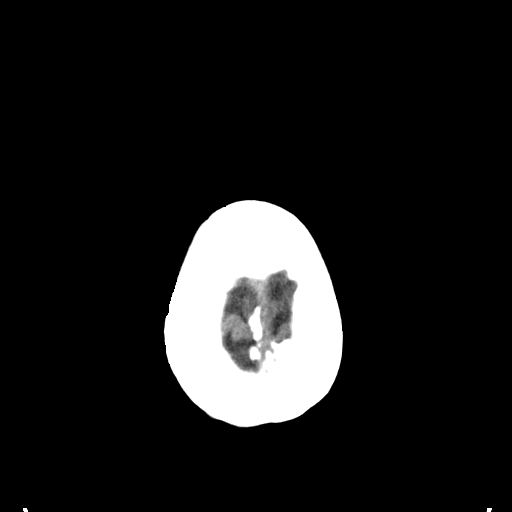
[im 29/30  brain]
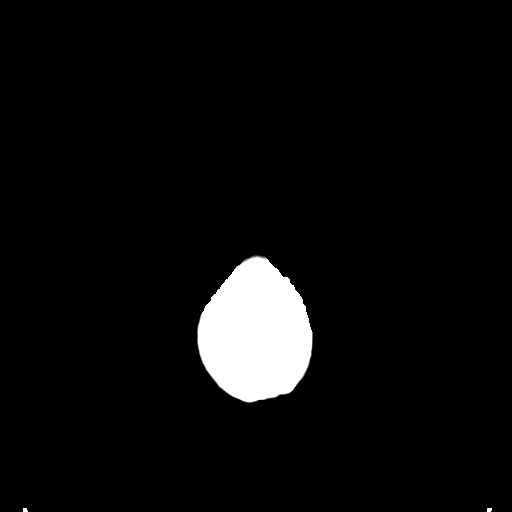

[15 of 30 positions shown; findings below may reference images not displayed]

FINDINGS: There is no evidence of mass effect, midline shift, or extra-axial
fluid collections. There is no evidence of a space-occupying lesion
or intracranial hemorrhage. There is no evidence of a cortical-based
area of acute infarction. There is generalized cerebral atrophy.
There is periventricular white matter low attenuation likely
secondary to microangiopathy. There is left parietal
encephalomalacia.

The ventricles and sulci are appropriate for the patient's age. The
basal cisterns are patent.

Visualized portions of the orbits are unremarkable. The visualized
portions of the paranasal sinuses and mastoid air cells are
unremarkable. Cerebrovascular atherosclerotic calcifications are
noted.

The osseous structures are unremarkable.
IMPRESSION: 1. No acute intracranial pathology.
2. Chronic microvascular disease and cerebral atrophy.
